# Patient Record
Sex: Female | Born: 1980 | Race: White | Hispanic: No | Marital: Married
Health system: Midwestern US, Community
[De-identification: ages and names within clinical notes are randomized; demographics above are authoritative.]

## PROBLEM LIST (undated history)

## (undated) DIAGNOSIS — D709 Neutropenia, unspecified: Secondary | ICD-10-CM

## (undated) DIAGNOSIS — F419 Anxiety disorder, unspecified: Secondary | ICD-10-CM

---

## 2014-04-28 NOTE — Progress Notes (Signed)
Diana Andrade is a 33 y.o. female Is here for a health maintenance exam.    Patient with some pain in left wrist after doing some lifting a few days ago. Better after using a splint.  She had one day of pain in mid back, worse with inspiration. No SOB. Resolved after one day. Took advil. Has a trigger finger and soreness in right thumb.   Patient is on prenatal vitamins. Sees GYN. Attempting pregnancy.    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 Better after PT.    Patient's last menstrual period was 04/08/2014 (exact date).    Allergies   Allergen Reactions   ??? Ceclor [Cefaclor] Swelling        History     Social History   ??? Marital Status: MARRIED     Spouse Name: N/A     Number of Children: N/A   ??? Years of Education: N/A     Social History Main Topics   ??? Smoking status: Never Smoker    ??? Smokeless tobacco: Never Used   ??? Alcohol Use: No   ??? Drug Use: Not on file   ??? Sexual Activity: Not on file     Other Topics Concern   ??? Not on file     Social History Narrative   ??? No narrative on file        Past Medical History   Diagnosis Date   ??? DDD (degenerative disc disease), cervical    ??? Atypical pigmented skin lesion      sees derm every 6 mos   ??? Dysplastic skin lesion      had excision  sees derm        Past Surgical History   Procedure Laterality Date   ??? Hx cesarean section       '12       Family History   Problem Relation Age of Onset   ??? Hypertension Mother    ??? Heart Disease Maternal Uncle      MI less than 50   ??? Heart Disease Maternal Grandfather      MI  less than 33 yo   ??? Cancer Paternal Grandfather      melanoma   and lung ca   ??? Diabetes Neg Hx         Current Outpatient Prescriptions   Medication Sig Dispense Refill   ??? escitalopram oxalate (LEXAPRO) 10 mg tablet Take 10 mg by mouth daily.     ??? PNV no.24-iron-folic acid-dha (PRENATAL DHA+COMPLETE PRENATAL) 30-975-300 mg-mcg-mg cmpk Take  by mouth.          ROS:  Constitutional: negative for fatigue   Eyes: negative for visual disturbance  Ears, nose, mouth, throat, and face: negative for nasal congestion  Respiratory: negative for dyspnea on exertion  Cardiovascular: negative for chest pain  Gastrointestinal: negative for melena and abdominal pain  Genitourinary:negative for dysuria  Integument/breast: negative for changed mole- sees derm on regular basis.       BP 104/74 mmHg   Pulse 76   Temp(Src) 98.6 ??F (37 ??C) (Oral)   Resp 16   Ht  (1.6 m)   Wt 118 lb (53.524 kg)   BMI 20.91 kg/m2   LMP 04/08/2014 (Exact Date)    @        Body mass index is 20.91 kg/(m^2).    Const Well, no acute distress  Eyes Conjunctiva and lids normal.  PERRLA.    ENMT External ears and nose normal.  Canals normal, TMs normal.  Hearing normal to voice.  Nose without edema or discharge, with normal septum.  Lips, teeth, gums normal.  Oropharynx: no erythema, no exudates, no lesions, normal tongue.  NECK:  Thyroid: normal size, nontender, without nodules.  Trachea midline, neck symmetrical and without masses.  RESP:  Normal air movement, and no retractions or accessory muscle use.  No rales, wheezes, rhonchi, or rubs.  CV: RRR, no murmur.  No calf tenderness. Negative Homan's  Extremities without edema.   GI:   soft, nontender, without masses.  No hepatosplenomegaly.    MSKEL:  Normal gait and station. no abnormal movement.  SKIN:  No rash, no lesions, no ulcers.  Skin warm, normal turgor, without induration or nodules.  PSYCH: .  Affect is alert and attentive.  He/Lymph:  No enlarged nodes in neck.           Diana Andrade was seen today for new patient.    Diagnoses and associated orders for this visit:    Routine general medical examination at a health care facility      patient to get [place] date of flu vaccine when done  Patient to get more info on father's colon lesion ?precancerous or cancer.    Diana Ghee, MD

## 2014-04-28 NOTE — Patient Instructions (Signed)
Trigger Finger: After Your Visit  Your Care Instructions  A trigger finger is a finger stuck in a bent position. The bent finger usually straightens out on its own. A trigger finger can be painful, but it normally is not a serious problem.  Trigger fingers seem to occur more in some groups of people. These include people who have diabetes or arthritis or who have injured their hands in the past. This problem also occurs in musicians and people who grip tools often.  Rest, exercises, and other things you can do at home may help your trigger finger relax so that it can bend as it should.  You may get a corticosteroid shot. This can reduce swelling and pain. Your doctor may put a splint on your finger. This will give your finger some rest and avoid irritating the joint. You may need surgery if the finger keeps locking in a bent position.  Follow-up care is a key part of your treatment and safety. Be sure to make and go to all appointments, and call your doctor if you are having problems. It's also a good idea to know your test results and keep a list of the medicines you take.  How can you care for yourself at home?  ?? If your doctor put a splint on your finger, wear the splint as directed. Do not remove it until your doctor says you can.  ?? You may need to change your activities to avoid movements that irritate the finger.  ?? If your finger is swollen, put ice or a cold pack on your finger for 10 to 20 minutes at a time. Try to do this every 1 to 2 hours for the next 3 days (when you are awake) or until the swelling goes down. Put a thin cloth between the ice and your skin.  ?? Prop up your hand on a pillow when you ice it or anytime you sit or lie down during the next 3 days. Try to keep it above the level of your heart. This will help reduce swelling.  ?? Take your medicines exactly as prescribed. Call your doctor if you think you are having a problem with your medicine.   ?? Ask your doctor if you can take an over-the-counter pain medicine, such as acetaminophen (Tylenol), ibuprofen (Advil, Motrin), or naproxen (Aleve). Be safe with medicines. Read and follow all instructions on the label.  ?? If your doctor recommends exercises, do them as directed.  When should you call for help?  Call your doctor now or seek immediate medical care if:  ?? Your finger locks in a bent position and will not straighten.  Watch closely for changes in your health, and be sure to contact your doctor if:  ?? You do not get better as expected.   Where can you learn more?   Go to http://www.healthwise.net/[facility]  Enter M826 in the search box to learn more about "Trigger Finger: After Your Visit."   ?? 2006-2015 Healthwise, Incorporated. Care instructions adapted under license by [place] (which disclaims liability or warranty for this information). This care instruction is for use with your licensed healthcare professional. If you have questions about a medical condition or this instruction, always ask your healthcare professional. Healthwise, Incorporated disclaims any warranty or liability for your use of this information.  Content Version: 10.5.422740; Current as of: September 21, 2013

## 2014-05-17 ENCOUNTER — Ambulatory Visit
Admit: 2014-05-17 | Discharge: 2014-05-17 | Payer: BLUE CROSS/BLUE SHIELD | Attending: Family Medicine | Primary: Family Medicine

## 2014-05-17 DIAGNOSIS — J029 Acute pharyngitis, unspecified: Secondary | ICD-10-CM

## 2014-05-17 LAB — AMB POC RAPID STREP A: Group A Strep Ag: POSITIVE

## 2014-05-17 MED ORDER — AMOXICILLIN 875 MG TAB
875 mg | ORAL_TABLET | Freq: Two times a day (BID) | ORAL | Status: DC
Start: 2014-05-17 — End: 2014-05-30

## 2014-05-17 NOTE — Progress Notes (Signed)
Diana Andrade is a 33 y.o. female that is being seen for Sore Throat    Sore throat and congestion no SOB    Allergies:  Allergies   Allergen Reactions   ??? Ceclor [Cefaclor] Swelling       Current Meds:  Current Outpatient Prescriptions   Medication Sig Dispense Refill   ??? amoxicillin (AMOXIL) 875 mg tablet Take 1 Tab by mouth two (2) times a day. 20 Tab 0   ??? escitalopram oxalate (LEXAPRO) 10 mg tablet Take 10 mg by mouth daily.     ??? PNV no.24-iron-folic acid-dha (PRENATAL DHA+COMPLETE PRENATAL) 30-975-300 mg-mcg-mg cmpk Take  by mouth.         PMH:  Past Medical History   Diagnosis Date   ??? DDD (degenerative disc disease), cervical    ??? Atypical pigmented skin lesion      sees derm every 6 mos   ??? Dysplastic skin lesion      had excision  sees derm       PSH:  Past Surgical History   Procedure Laterality Date   ??? Hx cesarean section       '12       FH:  Family History   Problem Relation Age of Onset   ??? Hypertension Mother    ??? Heart Disease Maternal Uncle      MI less than 50   ??? Heart Disease Maternal Grandfather      MI  less than 31 yo   ??? Cancer Paternal Grandfather      melanoma   and lung ca AND COLON CA   ??? Diabetes Neg Hx    ??? Cancer Paternal Grandmother      OVARIAN       Social Hx:  History     Social History   ??? Marital Status: MARRIED     Spouse Name: N/A     Number of Children: N/A   ??? Years of Education: N/A     Social History Main Topics   ??? Smoking status: Never Smoker    ??? Smokeless tobacco: Never Used   ??? Alcohol Use: No   ??? Drug Use: Not on file   ??? Sexual Activity: Not on file     Other Topics Concern   ??? Not on file     Social History Narrative       Physical Exam:  BP Readings from Last 3 Encounters:   05/17/14 110/70   04/28/14 104/74     Wt Readings from Last 3 Encounters:   05/17/14 120 lb (54.432 kg)   04/28/14 118 lb (53.524 kg)     Estimated body mass index is 21.26 kg/(m^2) as calculated from the following:    Height as of this encounter: 5\' 3"  (1.6 m).     Weight as of this encounter: 120 lb (54.432 kg).    BP 110/70 mmHg   Pulse 88   Temp(Src) 98.5 ??F (36.9 ??C) (Oral)   Resp 16   Ht 5\' 3"  (1.6 m)   Wt 120 lb (54.432 kg)   BMI 21.26 kg/m2   LMP 05/10/2014 (Exact Date)  General appearance: alert, cooperative, no distress, appears stated age  Ears: normal TM's and external ear canals AU  Throat: abnormal findings: moderate oropharyngeal erythema  Neck: supple, symmetrical, trachea midline and mild anterior cervical adenopathy  Lungs: clear to auscultation bilaterally  Heart: regular rate and rhythm, S1, S2 normal, no murmur, click, rub or gallop  Results for orders placed or performed in visit on 05/17/14   AMB POC RAPID STREP A   Result Value Ref Range    VALID INTERNAL CONTROL POC Yes     Group A Strep Ag Positive Negative       Assessment and Plan:  Diana Andrade was seen today for sore throat.    Diagnoses and associated orders for this visit:    Sore throat  - RAPID STREP ([id number])    Strep pharyngitis  - amoxicillin (AMOXIL) 875 mg tablet; Take 1 Tab by mouth two (2) times a day.      PATIENT TO GET COLONOSCOPY AT 42  Tell her GYN GM with ovarian cancer    Albertha Gheeynthia Hart Yigit Norkus, MD    ADDENDUM: HEALTH MAINTENANCE:  Health Maintenance   Topic Date Due   ??? Td Q 10 Yrs Age > 18  08/31/2001   ??? PAP AKA CERVICAL CYTOLOGY  09/01/2003   ??? INFLUENZA AGE 79 TO ADULT  03/05/2014   ??? Tdap Age > 18  Completed

## 2014-05-17 NOTE — Patient Instructions (Signed)
To tell GYN about FH ovarian ca. To get colonoscopy at age 33

## 2014-05-30 ENCOUNTER — Ambulatory Visit
Admit: 2014-05-30 | Discharge: 2014-05-30 | Payer: BLUE CROSS/BLUE SHIELD | Attending: Family Medicine | Primary: Family Medicine

## 2014-05-30 DIAGNOSIS — J029 Acute pharyngitis, unspecified: Secondary | ICD-10-CM

## 2014-05-30 LAB — AMB POC RAPID STREP A: Group A Strep Ag: POSITIVE

## 2014-05-30 MED ORDER — AZITHROMYCIN 250 MG TAB
250 mg | ORAL | Status: AC
Start: 2014-05-30 — End: 2014-06-04

## 2014-05-30 NOTE — Progress Notes (Signed)
Diana Andrade.   Diana Andrade is a 33 y.o. female that is being seen for Sore Throat    Finished amoxil 3 days ago. Congestion for 5 days and now with sore throat and chills. Had strep    Allergies:  Allergies   Allergen Reactions   ??? Ceclor [Cefaclor] Swelling       Current Meds:  Current Outpatient Prescriptions   Medication Sig Dispense Refill   ??? escitalopram oxalate (LEXAPRO) 10 mg tablet Take 10 mg by mouth daily.     ??? PNV no.24-iron-folic acid-dha (PRENATAL DHA+COMPLETE PRENATAL) 30-975-300 mg-mcg-mg cmpk Take  by mouth.         PMH:  Past Medical History   Diagnosis Date   ??? DDD (degenerative disc disease), cervical    ??? Atypical pigmented skin lesion      sees derm every 6 mos   ??? Dysplastic skin lesion      had excision  sees derm       PSH:  Past Surgical History   Procedure Laterality Date   ??? Hx cesarean section       '12       FH:  Family History   Problem Relation Age of Onset   ??? Hypertension Mother    ??? Heart Disease Maternal Uncle      MI less than 50   ??? Heart Disease Maternal Grandfather      MI  less than 31 yo   ??? Cancer Paternal Grandfather      melanoma   and lung ca AND COLON CA   ??? Diabetes Neg Hx    ??? Cancer Paternal Grandmother      OVARIAN       Social Hx:  History     Social History   ??? Marital Status: MARRIED     Spouse Name: N/A     Number of Children: N/A   ??? Years of Education: N/A     Social History Main Topics   ??? Smoking status: Never Smoker    ??? Smokeless tobacco: Never Used   ??? Alcohol Use: No   ??? Drug Use: Not on file   ??? Sexual Activity: Not on file     Other Topics Concern   ??? Not on file     Social History Narrative       Physical Exam:  BP Readings from Last 3 Encounters:   05/30/14 120/70   05/17/14 110/70   04/28/14 104/74     Wt Readings from Last 3 Encounters:   05/30/14 120 lb (54.432 kg)   05/17/14 120 lb (54.432 kg)   04/28/14 118 lb (53.524 kg)     Estimated body mass index is 21.26 kg/(m^2) as calculated from the following:    Height as of this encounter: 5\' 3"  (1.6 m).     Weight as of this encounter: 120 lb (54.432 kg).    BP 120/70 mmHg   Pulse 80   Temp(Src) 98.8 ??F (37.1 ??C) (Oral)   Resp 16   Ht 5\' 3"  (1.6 m)   Wt 120 lb (54.432 kg)   BMI 21.26 kg/m2   LMP 05/10/2014 (Exact Date)  General appearance: alert, cooperative, no distress, appears stated age  Ears: normal TM's and external ear canals AU  Throat: abnormal findings: moderate oropharyngeal erythema  Neck: supple, symmetrical, trachea midline and mild anterior cervical adenopathy  Lungs: clear to auscultation bilaterally  Heart: regular rate and rhythm, S1, S2 normal, no murmur, click,  rub or gallop    Results for orders placed or performed in visit on 05/30/14   AMB POC RAPID STREP A   Result Value Ref Range    VALID INTERNAL CONTROL POC Yes     Group A Strep Ag Positive Negative       Assessment and Plan:  Diana Andrade was seen today for sore throat.    Diagnoses and all orders for this visit:    Pharyngitis  Orders:  -     RAPID STREP ([id number])    Strep pharyngitis  Orders:  -     azithromycin (ZITHROMAX) 250 mg tablet; Take two tablets today then one tablet daily        Diana Gheeynthia Hart Atticus Wedin, MD    ADDENDUM: HEALTH MAINTENANCE:  Health Maintenance   Topic Date Due   ??? Td Q 10 Yrs Age > 18  08/31/2001   ??? PAP AKA CERVICAL CYTOLOGY  09/01/2003   ??? INFLUENZA AGE 23 TO ADULT  03/05/2014   ??? Tdap Age > 18  Completed

## 2014-07-04 ENCOUNTER — Ambulatory Visit
Admit: 2014-07-04 | Discharge: 2014-07-04 | Payer: BLUE CROSS/BLUE SHIELD | Attending: Family Medicine | Primary: Family Medicine

## 2014-07-04 ENCOUNTER — Encounter: Attending: Family Medicine | Primary: Family Medicine

## 2014-07-04 DIAGNOSIS — J029 Acute pharyngitis, unspecified: Secondary | ICD-10-CM

## 2014-07-04 LAB — AMB POC RAPID STREP A: Group A Strep Ag: NEGATIVE

## 2014-07-04 MED ORDER — AZITHROMYCIN 250 MG TAB
250 mg | ORAL | Status: AC
Start: 2014-07-04 — End: 2014-07-09

## 2014-07-04 MED ORDER — ESCITALOPRAM 10 MG TAB
10 mg | ORAL_TABLET | Freq: Every day | ORAL | Status: AC
Start: 2014-07-04 — End: ?

## 2014-07-04 NOTE — Progress Notes (Signed)
Diana Andrade is a 33 y.o. female that is being seen for Sore Throat and Diarrhea    Fever and sore throat. 2 days ago. Myalgias. No cough. No PND. No sinus pressure  Had diarrhea yesterday. Watery. No blood. None since yesterday. Some nausea.     Allergies:  Allergies   Allergen Reactions   ??? Ceclor [Cefaclor] Swelling       Current Meds:  Current Outpatient Prescriptions   Medication Sig Dispense Refill   ??? escitalopram oxalate (LEXAPRO) 10 mg tablet Take 1 Tab by mouth daily. 30 Tab 11   ??? azithromycin (ZITHROMAX) 250 mg tablet Take two tablets today then one tablet daily 6 Each 0   ??? PNV no.24-iron-folic acid-dha (PRENATAL DHA+COMPLETE PRENATAL) 30-975-300 mg-mcg-mg cmpk Take  by mouth.         PMH:  Past Medical History   Diagnosis Date   ??? DDD (degenerative disc disease), cervical    ??? Atypical pigmented skin lesion      sees derm every 6 mos   ??? Dysplastic skin lesion      had excision  sees derm       PSH:  Past Surgical History   Procedure Laterality Date   ??? Hx cesarean section       '12       FH:  Family History   Problem Relation Age of Onset   ??? Hypertension Mother    ??? Heart Disease Maternal Uncle      MI less than 50   ??? Heart Disease Maternal Grandfather      MI  less than 31 yo   ??? Cancer Paternal Grandfather      melanoma   and lung ca AND COLON CA   ??? Diabetes Neg Hx    ??? Cancer Paternal Grandmother      OVARIAN       Social Hx:  History     Social History   ??? Marital Status: MARRIED     Spouse Name: N/A     Number of Children: N/A   ??? Years of Education: N/A     Social History Main Topics   ??? Smoking status: Never Smoker    ??? Smokeless tobacco: Never Used   ??? Alcohol Use: No   ??? Drug Use: Not on file   ??? Sexual Activity: Not on file     Other Topics Concern   ??? Not on file     Social History Narrative       Physical Exam:  BP Readings from Last 3 Encounters:   07/04/14 110/72   05/30/14 120/70   05/17/14 110/70     Wt Readings from Last 3 Encounters:   07/04/14 121 lb (54.885 kg)    05/30/14 120 lb (54.432 kg)   05/17/14 120 lb (54.432 kg)     Estimated body mass index is 21.44 kg/(m^2) as calculated from the following:    Height as of this encounter: 5\' 3"  (1.6 m).    Weight as of this encounter: 121 lb (54.885 kg).    BP 110/72 mmHg   Pulse 76   Temp(Src) 99.2 ??F (37.3 ??C) (Oral)   Resp 16   Ht 5\' 3"  (1.6 m)   Wt 121 lb (54.885 kg)   BMI 21.44 kg/m2  General appearance: alert, cooperative, no distress, appears stated age  Ears: normal TM's and external ear canals AU  Throat: abnormal findings: moderate oropharyngeal erythema  Neck: supple, symmetrical, trachea midline  and no adenopathy  Lungs: clear to auscultation bilaterally  Heart: regular rate and rhythm, S1, S2 normal, no murmur, click, rub or gallop    Results for orders placed or performed in visit on 07/04/14   AMB POC RAPID STREP A   Result Value Ref Range    VALID INTERNAL CONTROL POC Yes     Group A Strep Ag Negative Negative       Assessment and Plan:  Diana Andrade was seen today for sore throat and diarrhea.    Diagnoses and all orders for this visit:    Pharyngitis  Orders:  -     RAPID STREP ([id number])  -     azithromycin (ZITHROMAX) 250 mg tablet; Take two tablets today then one tablet daily    Anxiety  Orders:  -     escitalopram oxalate (LEXAPRO) 10 mg tablet; Take 1 Tab by mouth daily.        Diana Gheeynthia Hart Travaris Kosh, MD    ADDENDUM: HEALTH MAINTENANCE:  Health Maintenance   Topic Date Due   ??? Td Q 10 Yrs Age > 18  08/31/2001   ??? PAP AKA CERVICAL CYTOLOGY  09/01/2003   ??? INFLUENZA AGE 22 TO ADULT  03/05/2014   ??? Tdap Age > 18  Completed

## 2015-07-04 ENCOUNTER — Encounter

## 2016-03-25 ENCOUNTER — Ambulatory Visit
Admit: 2016-03-25 | Discharge: 2016-03-25 | Payer: BLUE CROSS/BLUE SHIELD | Attending: Family Medicine | Primary: Family Medicine

## 2016-03-25 DIAGNOSIS — M26609 Unspecified temporomandibular joint disorder, unspecified side: Secondary | ICD-10-CM

## 2016-03-25 NOTE — Progress Notes (Signed)
Diana Andrade is a 35 y.o. female that is being seen for Ear Pain    Patient has developed some pain about the right TMJ post intubation a few weeks ago.  Pain is increased with chewing certain foods.  Also feels like there may be some discomfort with the right ear.  She has taken an occasional Advil.  Feels like she is not opening the mouth fully.  No sinus congestion.  No fever.          Allergies:  Allergies   Allergen Reactions   ??? Ceclor [Cefaclor] Swelling       Current Meds:  Current Outpatient Prescriptions   Medication Sig Dispense Refill   ??? escitalopram oxalate (LEXAPRO) 10 mg tablet Take 1 Tab by mouth daily. 30 Tab 11   ??? PNV no.24-iron-folic acid-dha (PRENATAL DHA+COMPLETE PRENATAL) 30-975-300 mg-mcg-mg cmpk Take  by mouth.         PMH:  Past Medical History:   Diagnosis Date   ??? Atypical pigmented skin lesion     sees derm every 6 mos   ??? DDD (degenerative disc disease), cervical    ??? Dysplastic skin lesion     had excision  sees derm       PSH:  Past Surgical History:   Procedure Laterality Date   ??? HX CESAREAN SECTION      '12       FH:  Family History   Problem Relation Age of Onset   ??? Hypertension Mother    ??? Heart Disease Maternal Uncle      MI less than 50   ??? Heart Disease Maternal Grandfather      MI  less than 69 yo   ??? Cancer Paternal Grandfather      melanoma   and lung ca AND COLON CA   ??? Diabetes Neg Hx    ??? Cancer Paternal Grandmother      OVARIAN       Social Hx:  Social History     Social History   ??? Marital status: MARRIED     Spouse name: N/A   ??? Number of children: N/A   ??? Years of education: N/A     Social History Main Topics   ??? Smoking status: Never Smoker   ??? Smokeless tobacco: Never Used   ??? Alcohol use No   ??? Drug use: None   ??? Sexual activity: Not Asked     Other Topics Concern   ??? None     Social History Narrative       Physical Exam:  BP Readings from Last 3 Encounters:   03/25/16 140/72   07/04/14 110/72   05/30/14 120/70     Wt Readings from Last 3 Encounters:    03/25/16 124 lb (56.2 kg)   07/04/14 121 lb (54.9 kg)   05/30/14 120 lb (54.4 kg)     Estimated body mass index is 21.97 kg/(m^2) as calculated from the following:    Height as of this encounter: 5\' 3"  (1.6 m).    Weight as of this encounter: 124 lb (56.2 kg).    Visit Vitals   ??? BP 140/72 (BP 1 Location: Left arm, BP Patient Position: Sitting)   ??? Pulse 83   ??? Temp 99.3 ??F (37.4 ??C) (Oral)   ??? Resp 16   ??? Ht 5\' 3"  (1.6 m)   ??? Wt 124 lb (56.2 kg)   ??? SpO2 100%   ??? BMI 21.97 kg/m2  General appearance: alert, cooperative, no distress, appears stated age  Head: Normocephalic, without obvious abnormality, atraumatic  Eyes: negative findings: anicteric sclera, lids and lashes normal and conjunctivae and sclerae normal  Ears: normal TM's and external ear canals right ear  Throat: Lips, mucosa, and tongue normal. Teeth and gums normal and normal findings: Oropharynx normal  Neck: No lymphadenopathy  Skin: No rashes or lesions  Full range of motion of the right TMJ.  No crepitus noted.  No tenderness.  No popping.    No results found for any visits on 03/25/16.    Assessment and Plan:  Diagnoses and all orders for this visit:    1. Temporomandibular disorder    To take ibuprofen 200 mg 3 tablets twice a day with food  Handout given  To avoid any foods that cause increased pain.  Recheck if pain persists more than next 10-14 days.    Creta Levinynthia H Akima Slaugh, MD    Patient Instructions        Temporomandibular Disorder: Care Instructions  Your Care Instructions    Temporomandibular (TM) disorders are a problem with the muscles and joints that connect your jaw to your skull. They cause pain when you open your mouth, chew, or yawn. You may feel this pain on one or both sides.  TM disorders are often caused by tight jaw muscles. The tightness can be caused by clenching or grinding your teeth. This may happen when you have a lot of stress in your life.  If you lower your stress, you may be able to stop clenching or grinding  your teeth. This will help relax your jaw and reduce your pain.  You may also be able to do some things at home to feel better. But if none of this works, your doctor may prescribe medicine to help relax your muscles and control the pain.  Follow-up care is a key part of your treatment and safety. Be sure to make and go to all appointments, and call your doctor if you are having problems. It's also a good idea to know your test results and keep a list of the medicines you take.  How can you care for yourself at home?  ?? Put a warm, moist cloth or heating pad set on low on your jaw. Do this for 10 to 20 minutes at a time. Put a thin cloth between the heating pad and your skin.  ?? Avoid hard or chewy foods that cause your jaws to work very hard. Examples include popcorn, jerky, tough meats, chewy breads, gum, and raw apples and carrots.  ?? Choose softer foods that are easy to chew. These include eggs, yogurt, and soup.  ?? Cut your food into small pieces. Chew slowly.  ?? If your jaw gets too painful to chew, or if it locks, you may need to puree your food for a few days or weeks.  ?? To relax your jaw, repeat this exercise for a few minutes every morning and evening. Watch yourself in a mirror. Gently open and close your mouth. Move your jaw straight up and down. But don't do this if it makes your pain worse.  ?? Get at least 30 minutes of exercise on most days of the week to relieve stress. Walking is a good choice. You also may want to do other activities, such as running, swimming, cycling, or playing tennis or team sports.  ?? Do not:  ?? Hold a phone between your shoulder and your jaw.  ??  Open your mouth all the way, like when you sing loudly or yawn.  ?? Clench or grind your teeth, bite your lips, or chew your fingernails.  ?? Clench things such as pens, pipes, or cigars between your teeth.  When should you call for help?  Call your doctor now or seek immediate medical care if:   ?? Your jaw is locked open or shut or it is hard to move your jaw.  Watch closely for changes in your health, and be sure to contact your doctor if:  ?? Your jaw pain gets worse.  ?? Your face is swollen.  ?? You do not get better as expected.  Where can you learn more?  Go to [employer]http://www.healthwise.net/[facility].  Enter [phone] in the search box to learn more about "Temporomandibular Disorder: Care Instructions."  Current as of: March 14, 2015  Content Version: 11.3  ?? 2006-2017 [employer]. Care instructions adapted under license by [employer] (which disclaims liability or warranty for this information). If you have questions about a medical condition or this instruction, always ask your healthcare professional. [employer] disclaims any warranty or liability for your use of this information.         ADDENDUM: HEALTH MAINTENANCE:  Health Maintenance   Topic Date Due   ??? DTaP/Tdap/Td series (1 - Tdap) 09/01/2003   ??? PAP AKA CERVICAL CYTOLOGY  09/01/2003   ??? INFLUENZA AGE 25 TO ADULT  03/05/2016

## 2016-03-25 NOTE — Patient Instructions (Signed)
Temporomandibular Disorder: Care Instructions  Your Care Instructions    Temporomandibular (TM) disorders are a problem with the muscles and joints that connect your jaw to your skull. They cause pain when you open your mouth, chew, or yawn. You may feel this pain on one or both sides.  TM disorders are often caused by tight jaw muscles. The tightness can be caused by clenching or grinding your teeth. This may happen when you have a lot of stress in your life.  If you lower your stress, you may be able to stop clenching or grinding your teeth. This will help relax your jaw and reduce your pain.  You may also be able to do some things at home to feel better. But if none of this works, your doctor may prescribe medicine to help relax your muscles and control the pain.  Follow-up care is a key part of your treatment and safety. Be sure to make and go to all appointments, and call your doctor if you are having problems. It's also a good idea to know your test results and keep a list of the medicines you take.  How can you care for yourself at home?  ?? Put a warm, moist cloth or heating pad set on low on your jaw. Do this for 10 to 20 minutes at a time. Put a thin cloth between the heating pad and your skin.  ?? Avoid hard or chewy foods that cause your jaws to work very hard. Examples include popcorn, jerky, tough meats, chewy breads, gum, and raw apples and carrots.  ?? Choose softer foods that are easy to chew. These include eggs, yogurt, and soup.  ?? Cut your food into small pieces. Chew slowly.  ?? If your jaw gets too painful to chew, or if it locks, you may need to puree your food for a few days or weeks.  ?? To relax your jaw, repeat this exercise for a few minutes every morning and evening. Watch yourself in a mirror. Gently open and close your mouth. Move your jaw straight up and down. But don't do this if it makes your pain worse.  ?? Get at least 30 minutes of exercise on most days of the week to relieve  stress. Walking is a good choice. You also may want to do other activities, such as running, swimming, cycling, or playing tennis or team sports.  ?? Do not:  ?? Hold a phone between your shoulder and your jaw.  ?? Open your mouth all the way, like when you sing loudly or yawn.  ?? Clench or grind your teeth, bite your lips, or chew your fingernails.  ?? Clench things such as pens, pipes, or cigars between your teeth.  When should you call for help?  Call your doctor now or seek immediate medical care if:  ?? Your jaw is locked open or shut or it is hard to move your jaw.  Watch closely for changes in your health, and be sure to contact your doctor if:  ?? Your jaw pain gets worse.  ?? Your face is swollen.  ?? You do not get better as expected.  Where can you learn more?  Go to http://www.healthwise.net/GoodHelpConnections.  Enter P868 in the search box to learn more about "Temporomandibular Disorder: Care Instructions."  Current as of: March 14, 2015  Content Version: 11.3  ?? 2006-2017 [employer]. Care instructions adapted under license by [employer] (which disclaims liability or warranty for this information). If   you have questions about a medical condition or this instruction, always ask your healthcare professional. [employer] disclaims any warranty or liability for your use of this information.

## 2016-09-02 ENCOUNTER — Ambulatory Visit
Admit: 2016-09-02 | Discharge: 2016-09-02 | Payer: BLUE CROSS/BLUE SHIELD | Attending: Family Medicine | Primary: Family Medicine

## 2016-09-02 DIAGNOSIS — R6889 Other general symptoms and signs: Secondary | ICD-10-CM

## 2016-09-02 LAB — AMB POC RAPID INFLUENZA TEST
Influenza A Ag POC: NEGATIVE Pos/Neg
Influenza B Ag POC: NEGATIVE Pos/Neg

## 2016-09-02 MED ORDER — OSELTAMIVIR PHOSPHATE 75 MG CAP
75 mg | ORAL_CAPSULE | Freq: Two times a day (BID) | ORAL | 0 refills | Status: AC
Start: 2016-09-02 — End: 2016-09-07

## 2016-09-02 NOTE — Progress Notes (Signed)
Diana Andrade is a 36 y.o. female that is being seen for Generalized Body Aches    Patient on Bactrim for sinusitis.  She has myalgias.  Some chills.  No sore throat.  No cough.  Feels extremely fatigued.  Has been exposed to the fluid school.          Allergies:  Allergies   Allergen Reactions   ??? Ceclor [Cefaclor] Swelling       Current Meds:  Current Outpatient Prescriptions   Medication Sig Dispense Refill   ??? acetaminophen (TYLENOL) 500 mg tablet Take  by mouth.     ??? trimethoprim-sulfamethoxazole (BACTRIM DS, SEPTRA DS) 160-800 mg per tablet TAKE 1 TABLET (ORAL) 2 TIMES PER DAY FOR 10 DAYS  0   ??? oseltamivir (TAMIFLU) 75 mg capsule Take 1 Cap by mouth two (2) times a day for 5 days. 10 Cap 0   ??? escitalopram oxalate (LEXAPRO) 10 mg tablet Take 1 Tab by mouth daily. 30 Tab 11       PMH:  Past Medical History:   Diagnosis Date   ??? Atypical pigmented skin lesion     sees derm every 6 mos   ??? DDD (degenerative disc disease), cervical    ??? Dysplastic skin lesion     had excision  sees derm       PSH:  Past Surgical History:   Procedure Laterality Date   ??? HX CESAREAN SECTION      '12       FH:  Family History   Problem Relation Age of Onset   ??? Hypertension Mother    ??? Heart Disease Maternal Uncle      MI less than 50   ??? Heart Disease Maternal Grandfather      MI  less than 34 yo   ??? Cancer Paternal Grandfather      melanoma   and lung ca AND COLON CA   ??? Diabetes Neg Hx    ??? Cancer Paternal Grandmother      OVARIAN       Social Hx:  Social History     Social History   ??? Marital status: MARRIED     Spouse name: N/A   ??? Number of children: N/A   ??? Years of education: N/A     Social History Main Topics   ??? Smoking status: Never Smoker   ??? Smokeless tobacco: Never Used   ??? Alcohol use No   ??? Drug use: None   ??? Sexual activity: Not Asked     Other Topics Concern   ??? None     Social History Narrative       Physical Exam:  BP Readings from Last 3 Encounters:   09/02/16 104/60   03/25/16 140/72   07/04/14 110/72      Wt Readings from Last 3 Encounters:   09/02/16 129 lb (58.5 kg)   03/25/16 124 lb (56.2 kg)   07/04/14 121 lb (54.9 kg)     Estimated body mass index is 22.85 kg/(m^2) as calculated from the following:    Height as of this encounter: 5\' 3"  (1.6 m).    Weight as of this encounter: 129 lb (58.5 kg).    Visit Vitals   ??? BP 104/60 (BP 1 Location: Left arm, BP Patient Position: Sitting)   ??? Pulse 90   ??? Temp 99.5 ??F (37.5 ??C) (Oral)   ??? Resp 16   ??? Ht 5\' 3"  (1.6 m)   ???  Wt 129 lb (58.5 kg)   ??? LMP 07/28/2016   ??? SpO2 99%   ??? BMI 22.85 kg/m2     General appearance: alert, cooperative, no distress, appears stated age  Head: Normocephalic, without obvious abnormality, atraumatic  Eyes: negative findings: anicteric sclera, lids and lashes normal and conjunctivae and sclerae normal  Ears: normal TM's and external ear canals AU  Throat: Lips, mucosa, and tongue normal. Teeth and gums normal and normal findings: oropharynx pink & moist without lesions or evidence of thrush  Neck: supple, symmetrical, trachea midline  Lungs: clear to auscultation bilaterally  Heart: regular rate and rhythm, S1, S2 normal, no murmur, click, rub or gallop  Skin: Skin color, texture, turgor normal. No rashes or lesions  Affect normal    Results for orders placed or performed in visit on 09/02/16   AMB POC RAPID INFLUENZA TEST   Result Value Ref Range    VALID INTERNAL CONTROL POC Yes     Influenza A Ag POC Negative Negative Pos/Neg    Influenza B Ag POC Negative Negative Pos/Neg       Assessment and Plan:  Diagnoses and all orders for this visit:    1. Flu-like symptoms  -     INFLUENZA TEST A & B (02725)    2. Screening for deficiency anemia  -     CBC WITH AUTOMATED DIFF; Future    3. Screening, lipid  -     LIPID PANEL WITH LDL/HDL RATIO; Future    4. Screening for diabetes mellitus  -     CMP; Future    5. Screening for blood or protein in urine  -     UA/M W/RFLX CULTURE, COMP; Future    Other orders   -     oseltamivir (TAMIFLU) 75 mg capsule; Take 1 Cap by mouth two (2) times a day for 5 days.-To monitor symptoms over the next few hours and start if worsening    Not to get labs done until I review her labs done at work          Diana Levin, MD    There are no Patient Instructions on file for this visit.     ADDENDUM: HEALTH MAINTENANCE:  Health Maintenance   Topic Date Due   ??? DTaP/Tdap/Td series (1 - Tdap) 09/01/2003   ??? PAP AKA CERVICAL CYTOLOGY  09/01/2003   ??? Influenza Age 69 to Adult  03/05/2016

## 2016-09-05 ENCOUNTER — Encounter: Attending: Family Medicine | Primary: Family Medicine

## 2016-09-24 ENCOUNTER — Ambulatory Visit
Admit: 2016-09-24 | Discharge: 2016-09-24 | Payer: BLUE CROSS/BLUE SHIELD | Attending: Family Medicine | Primary: Family Medicine

## 2016-09-24 DIAGNOSIS — Z0000 Encounter for general adult medical examination without abnormal findings: Secondary | ICD-10-CM

## 2016-09-24 NOTE — Progress Notes (Signed)
Diana Andrade is a 35 y.o. female Is here for a health maintenance exam.  Patient does want a gyn exam.    Specific concerns today: none.  She does not need to consider a mammogram at this time.  Patient's last Pap was a couple of year(s) ago  She has not had a hysterectomy  She does not have a history of abnormal Paps.  Patient's last menstrual period was 09/18/2016 (exact date).  Grandmother with ovarian cancer. +FH colon cancer and melanoma.   Allergies   Allergen Reactions   ??? Ceclor [Cefaclor] Swelling        Social History     Social History   ??? Marital status: MARRIED     Spouse name: N/A   ??? Number of children: N/A   ??? Years of education: N/A     Social History Main Topics   ??? Smoking status: Never Smoker   ??? Smokeless tobacco: Never Used   ??? Alcohol use No   ??? Drug use: None   ??? Sexual activity: Not Asked     Other Topics Concern   ??? None     Social History Narrative        Past Medical History:   Diagnosis Date   ??? Atypical pigmented skin lesion     sees derm every 6 mos   ??? Chronic leukopenia    ??? DDD (degenerative disc disease), cervical    ??? Dysplastic skin lesion     had excision  sees derm        Past Surgical History:   Procedure Laterality Date   ??? HX CESAREAN SECTION      '12   ??? HX OOPHORECTOMY      bilateral  due to ectopic preg       Family History   Problem Relation Age of Onset   ??? Hypertension Mother    ??? Heart Disease Maternal Uncle      MI less than 50   ??? Heart Disease Maternal Grandfather      MI  less than 11 yo   ??? Cancer Paternal Grandfather      melanoma   and lung ca AND COLON CA   ??? Cancer Paternal Grandmother      OVARIAN   ??? Diabetes Neg Hx         Current Outpatient Prescriptions   Medication Sig Dispense Refill   ??? acetaminophen (TYLENOL) 500 mg tablet Take  by mouth.     ??? escitalopram oxalate (LEXAPRO) 10 mg tablet Take 1 Tab by mouth daily. 30 Tab 11        ROS:     Ears, nose, mouth, throat, and face: positive for nasal congestion  Respiratory: positive for cough   Cardiovascular: neg for CP  Gastrointestinal: negative for melena and abdominal pain  Genitourinary:negative for dysuria  Integument/breast: negative for breast lump  Musculoskeletal:negative  Neurological: negative      Visit Vitals   ??? BP 120/70 (BP 1 Location: Left arm, BP Patient Position: Sitting)   ??? Pulse 89   ??? Temp 98.8 ??F (37.1 ??C) (Oral)   ??? Resp 16   ??? Ht 5\' 3"  (1.6 m)   ??? Wt 127 lb (57.6 kg)   ??? LMP 09/18/2016 (Exact Date)   ??? SpO2 99%   ??? BMI 22.5 kg/m2       @WEIGHTCHANGE @        Body mass index is 22.5 kg/(m^2).    Const  Well, no acute distress  Eyes Conjunctiva and lids normal.  PERRLA.    ENMT External ears and nose normal.  Canals normal, TMs normal.  Hearing normal to voice.  Nose without edema or discharge, with normal septum.  Lips, teeth, gums normal.  Oropharynx: no erythema, no exudates, no lesions, normal tongue.  NECK:  Thyroid: normal size, nontender, without nodules.  Trachea midline, neck symmetrical and without masses.  RESP:  Normal air movement, and no retractions or accessory muscle use.  No rales, wheezes, rhonchi, or rubs.  CV: RRR, no murmur.  Extremities without edema.   GI:   soft, nontender, without masses.  No hepatosplenomegaly.  MSKEL:  Normal gait and station.  Normal digits and nails.  Normal strength and tone, no atrophy, and no abnormal movement.  SKIN:  No rash, no lesions, no ulcers.  Skin warm, normal turgor, without induration or nodules.  PSYCH: .  Affect is alert and attentive.  He/Lymph:  No enlarged nodes in neck.    BREAST EXAM: breasts appear normal, no suspicious masses, no skin or nipple changes or axillary nodes  PELVIC EXAM: normal external genitalia, vulva, vagina, cervix, uterus and adnexa       Diagnoses and all orders for this visit:    1. Health maintenance examination    2. Screening for cervical cancer  -     PAP, IG, RFX HPV ASCUS ([id number])      Urged patient to consider genetic testing (GM with ovarian cancer, GF with melanoma and colon cancer.)     Patient to email copy of recent labs to us.     Diana Levinynthia H Hani Campusano, MD      There are no Patient Instructions on file for this visit.

## 2016-09-28 LAB — PAP, IG, RFX HPV ASCUS (507301)
.: 0
LABCORP 019018: 0

## 2016-09-29 ENCOUNTER — Encounter

## 2016-10-18 ENCOUNTER — Encounter: Admit: 2016-10-18 | Discharge: 2016-10-18 | Payer: BLUE CROSS/BLUE SHIELD | Primary: Family Medicine

## 2016-10-18 DIAGNOSIS — D709 Neutropenia, unspecified: Secondary | ICD-10-CM

## 2016-10-19 LAB — FOLATE: Folate: 15.5 ng/mL (ref >3.0)

## 2016-10-19 LAB — VITAMIN B12: Vitamin B12: 409 pg/mL (ref 232–1245)

## 2016-10-27 LAB — PATHOLOGIST REVIEW SMEARS
ABS. BASOPHILS: 0 10*3/uL (ref 0.0–0.2)
ABS. EOSINOPHILS: 0.1 10*3/uL (ref 0.0–0.4)
ABS. IMM. GRANS.: 0 10*3/uL (ref 0.0–0.1)
ABS. MONOCYTES: 0.2 10*3/uL (ref 0.1–0.9)
ABS. NEUTROPHILS: 1.8 10*3/uL (ref 1.4–7.0)
Abs Lymphocytes: 1.8 10*3/uL (ref 0.7–3.1)
BASOPHILS: 1 %
Basophils %: 1 %
Basophils Absolute: 0 10*3/uL (ref 0.0–0.2)
EOSINOPHILS: 2 %
Eosinophils %: 2 %
Eosinophils Absolute: 0.1 10*3/uL (ref 0.0–0.4)
Granulocyte Absolute Count: 0 10*3/uL (ref 0.0–0.1)
HCT: 39.2 % (ref 34.0–46.6)
HGB: 13.3 g/dL (ref 11.1–15.9)
Hematocrit: 39.2 % (ref 34.0–46.6)
Hemoglobin: 13.3 g/dL (ref 11.1–15.9)
IMMATURE GRANULOCYTES: 0 %
Immature Granulocytes: 0 %
Lymphocytes %: 46 %
Lymphocytes Absolute: 1.8 10*3/uL (ref 0.7–3.1)
Lymphocytes: 46 %
MCH: 30.2 pg (ref 26.6–33.0)
MCH: 30.2 pg (ref 26.6–33.0)
MCHC: 33.9 g/dL (ref 31.5–35.7)
MCHC: 33.9 g/dL (ref 31.5–35.7)
MCV: 89 fL (ref 79–97)
MCV: 89 fL (ref 79–97)
MONOCYTES: 6 %
Monocytes %: 6 %
Monocytes Absolute: 0.2 10*3/uL (ref 0.1–0.9)
NEUTROPHILS: 45 %
Neutrophils %: 45 %
Neutrophils Absolute: 1.8 10*3/uL (ref 1.4–7.0)
PLATELET: 281 10*3/uL (ref 150–379)
PLTS, 005070: NORMAL
Platelets: 281 10*3/uL (ref 150–379)
Platelets: NORMAL
RBC, 005068: NORMAL
RBC: 4.4 x10E6/uL (ref 3.77–5.28)
RBC: 4.4 x10E6/uL (ref 3.77–5.28)
RBC: NORMAL
RDW: 13.4 % (ref 12.3–15.4)
RDW: 13.4 % (ref 12.3–15.4)
WBC, 005069: NORMAL
WBC: 3.9 10*3/uL (ref 3.4–10.8)
WBC: 3.9 10*3/uL (ref 3.4–10.8)
WBC: NORMAL

## 2016-10-27 NOTE — Progress Notes (Signed)
Repeat labs normal  Diana Garriga H Irving Lubbers, MD

## 2022-09-23 IMAGING — MG MAMMO BREAST SCREENING TOMOSYNTHESIS BILATERAL
9 of 14 series · 9 of 30 positions shown · non-contrast
Comparison: None.

This is a summary report. The complete report is available in the patient's medical record. If you cannot access the medical record, please contact the sending organization for a detailed fax or copy.
FINAL REPORT:
EXAM: MAMMO BREAST SCREENING TOMOSYNTHESIS 3D BILATERAL
CLINICAL HISTORY: Screening mammogram. No complaints.
TECHNIQUE: MLO and CC views of both breasts with tomosynthesis were obtained on a digital full-field mammographic unit. This examination was interpreted in conjunction with computer aided detection system. Nipple and skin markers were placed as needed.

[L XCCL]
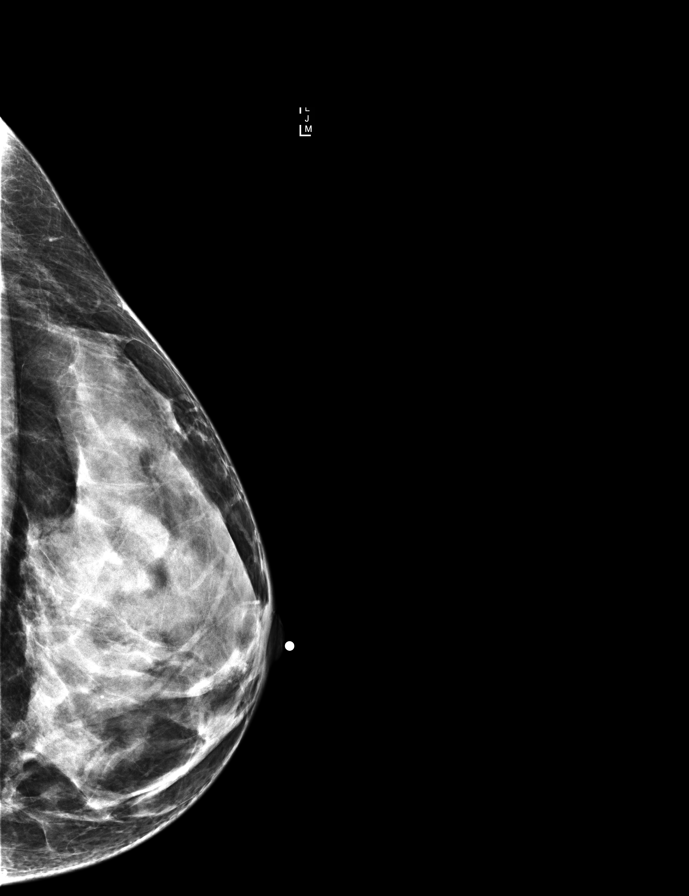

[R XCCL]
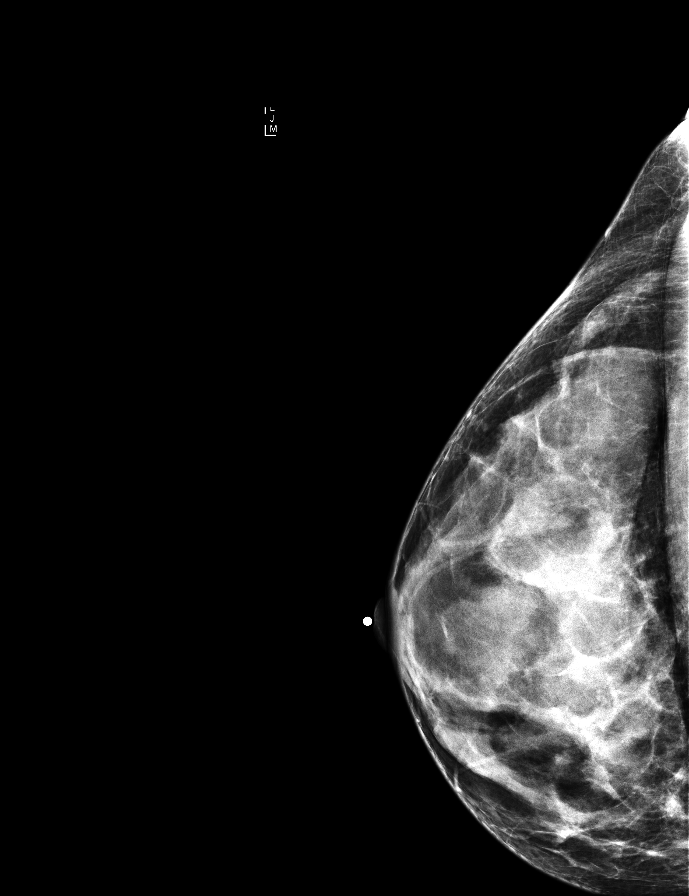

[R MLO]
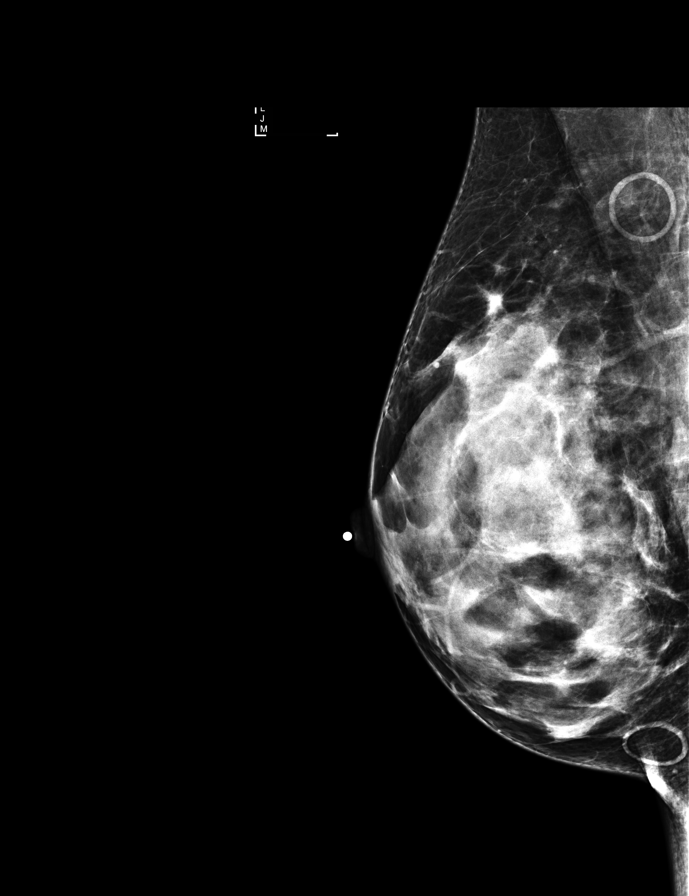

[R CC synth-2D]
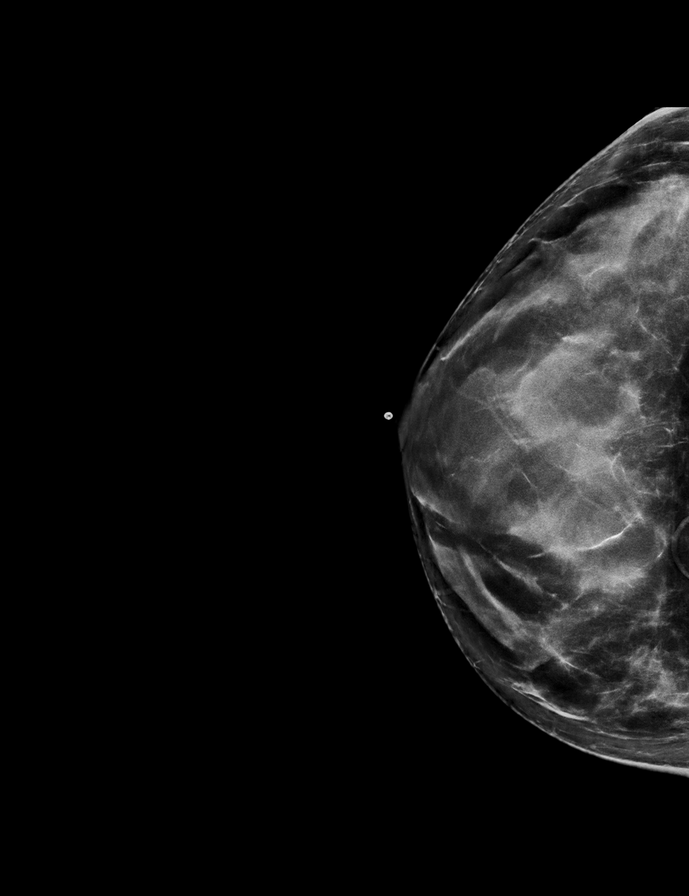

[R MLO synth-2D]
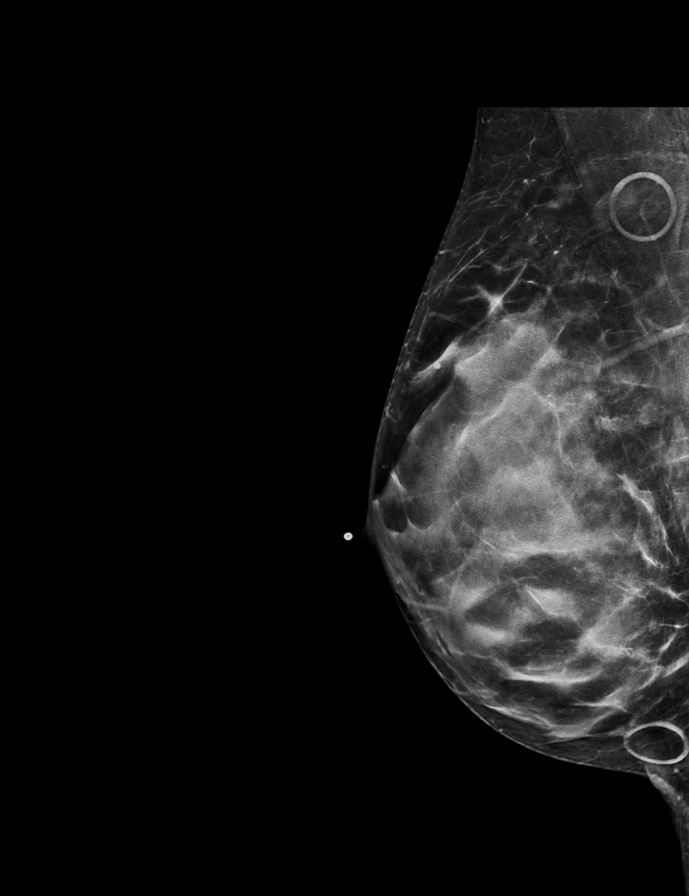

[R CC]
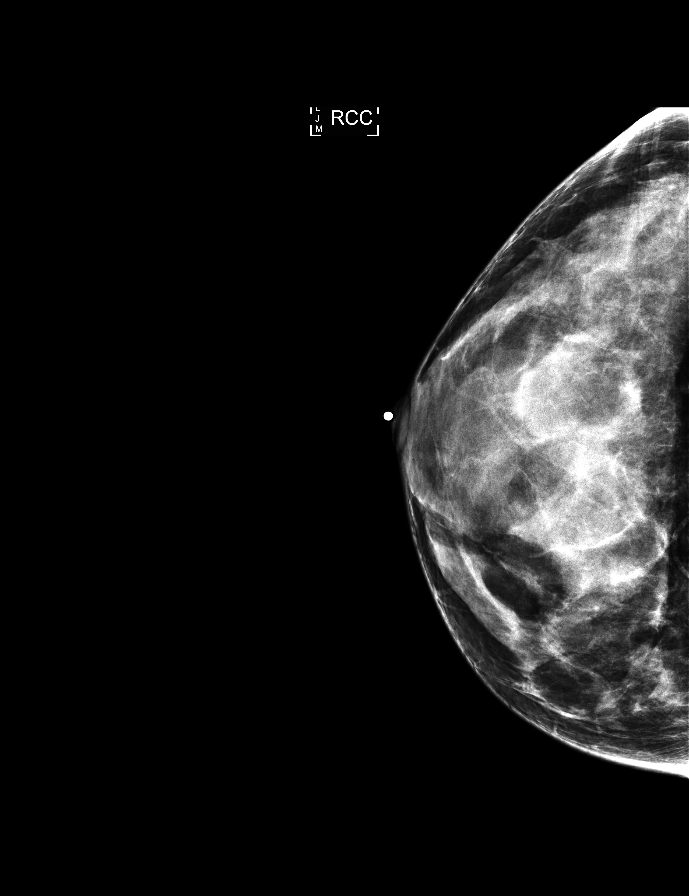

[L MLO synth-2D]
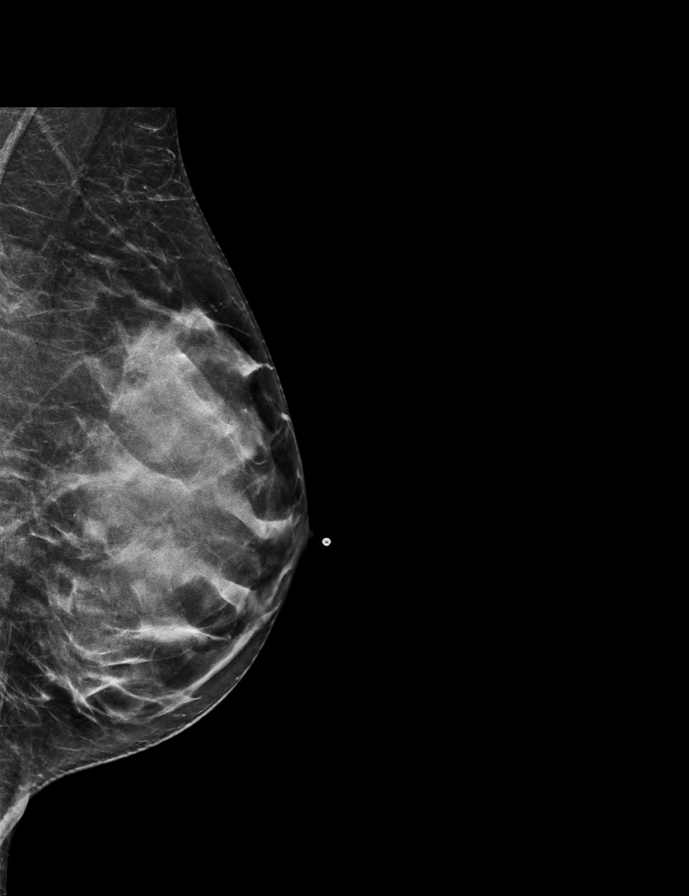

[L CC]
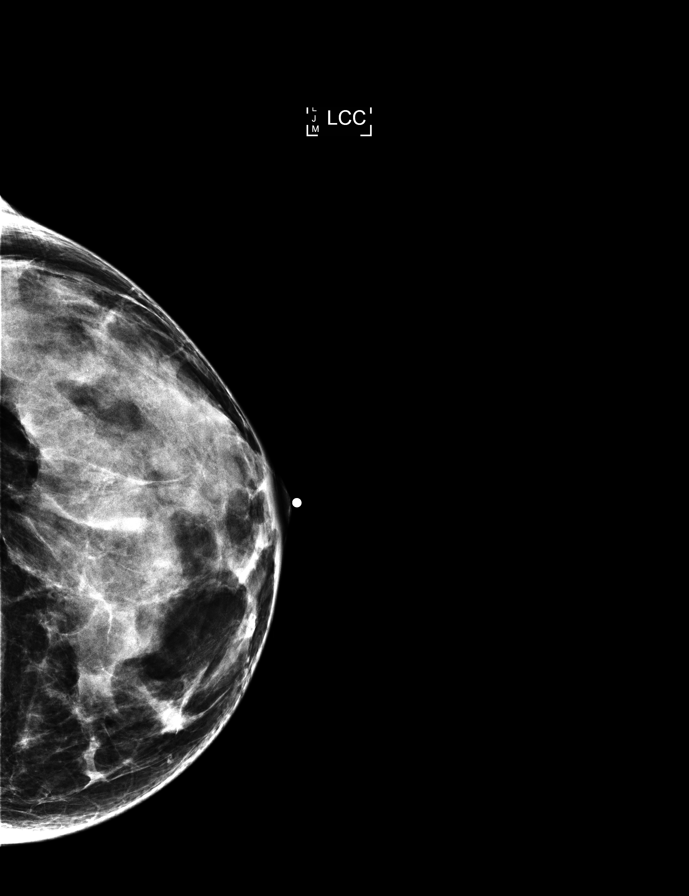

[L MLO]
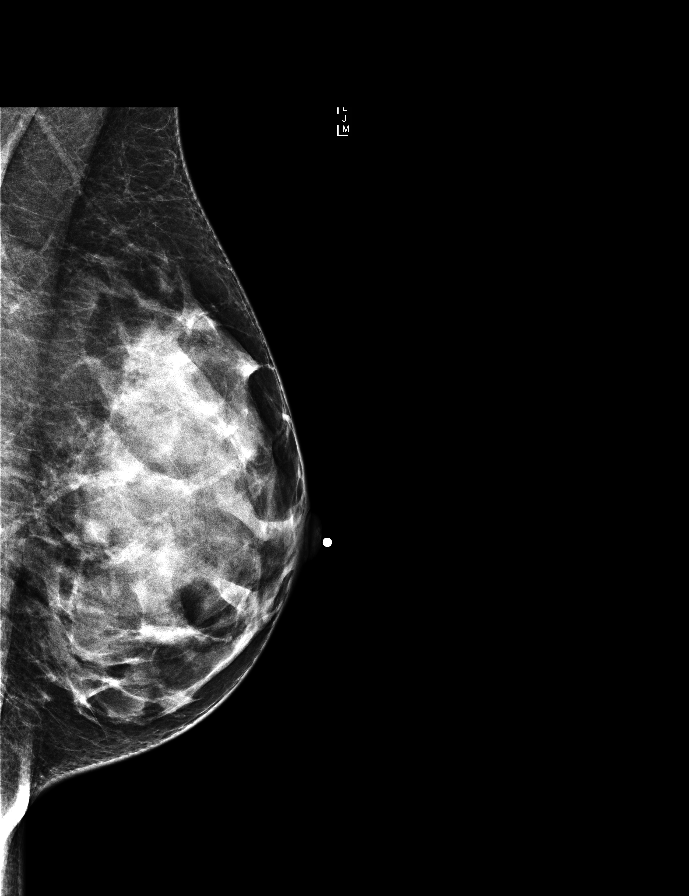

[9 of 30 positions shown; findings below may reference images not displayed]

FINDINGS: BREAST DENSITY TYPE D: The breasts are extremely dense, which lowers the sensitivity of mammography.
When compared to the prior exam, there has been no significant interval change. The skin, nipples, and both axillae are unremarkable. There is no evidence of a dominant mass, suspicious cluster of calcifications, or architectural distortion in either breast.
IMPRESSION: No mammographic evidence of malignancy.
BI-RADS Category 1: Negative
Recommendation(s): Bilateral screening mammogram in one year.
The patient information will be entered into a reminder system with a target due date for the next mammogram.

## 2023-05-13 IMAGING — MR MRI THORACIC SPINE WITH AND  WITHOUT CONTRAST
6 of 11 series · 29 of 48 positions shown · IV contrast (agent unspecified)
Comparison: None.

FINAL REPORT:
EXAM: MRI CERVICAL SPINE WITH AND WITHOUT CONTRAST, MRI LUMBAR SPINE WITH AND WITHOUT CONTRAST, MRI THORACIC SPINE WITH AND  WITHOUT CONTRAST
CLINICAL INDICATION:  Atypical facial pain.
TECHNIQUE: Pre-contrast sagittal T1-, T2-, and T2-w fat-saturated images, and axial T1-w and T2-w images of the cervical, thoracic, and lumbar spine. Post-contrast axial T1-w and sagittal T1-w fat-saturated images. Intravenous contrast material was administered for the examination.

[Series 13: T2 · sagittal · 4.0mm · 0.76mm/px · 3 of 15 slices shown (1 of 3)]
[im 1/15]
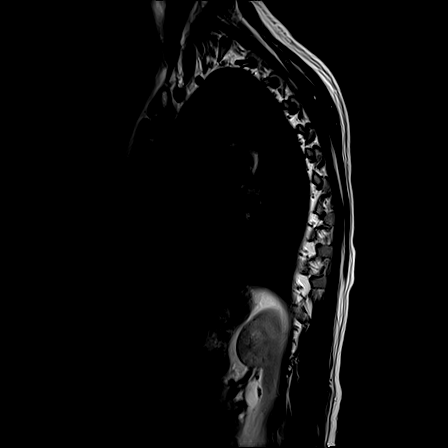
[im 8/15]
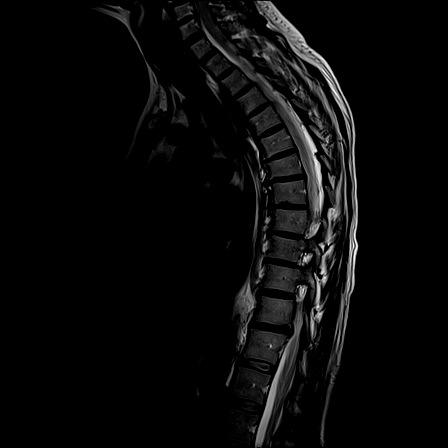
[im 15/15]
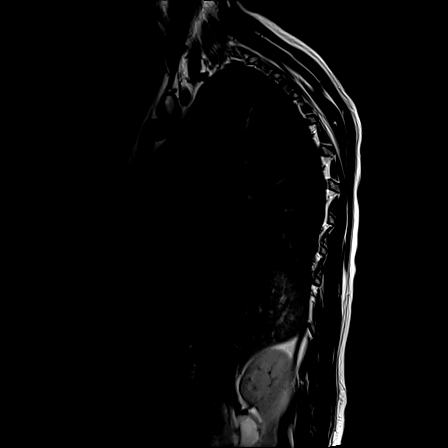

[Series 14: T1 · sagittal · 4.0mm · 1.06mm/px · 3 of 15 slices shown (1 of 3)]
[im 1/15]
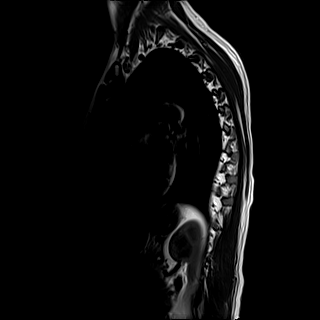
[im 8/15]
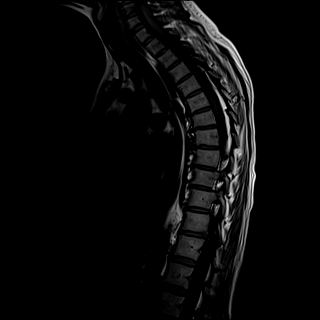
[im 15/15]
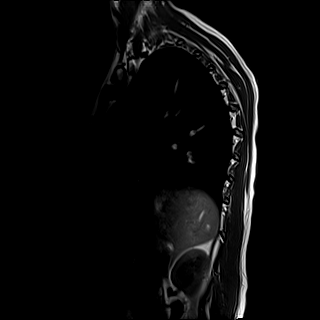

[Series 16: T2 · axial · 4.0mm · 0.78mm/px · z∈[-58,+111]mm · 6 of 40 slices shown (2 of 3)]
[im 1/40]
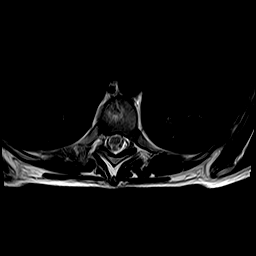
[im 8/40]
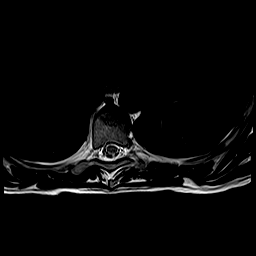
[im 16/40]
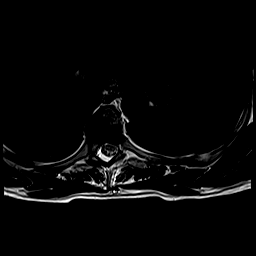
[im 24/40]
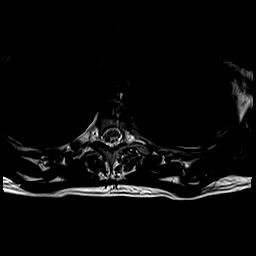
[im 32/40]
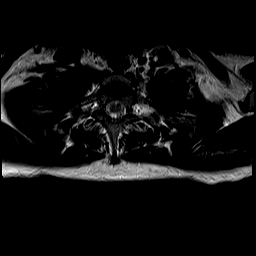
[im 40/40]
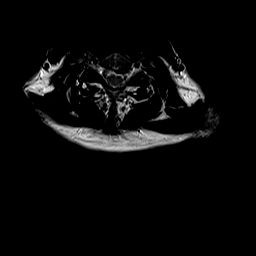

[Series 17: T2 · axial · 4.0mm · 0.78mm/px · z∈[-159,-2]mm · 6 of 37 slices shown (3 of 3)]
[im 1/37]
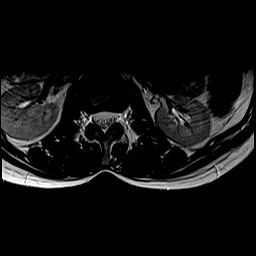
[im 8/37]
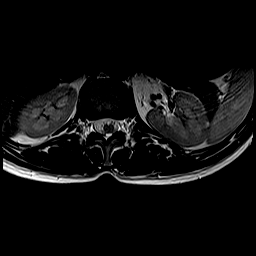
[im 15/37]
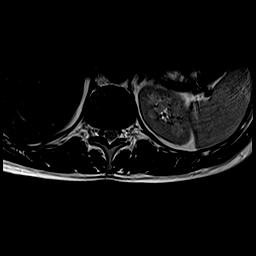
[im 22/37]
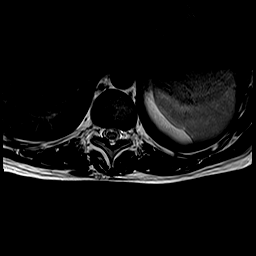
[im 29/37]
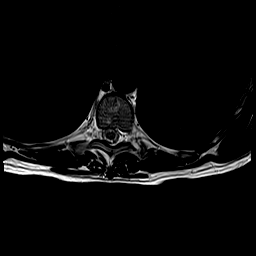
[im 37/37]
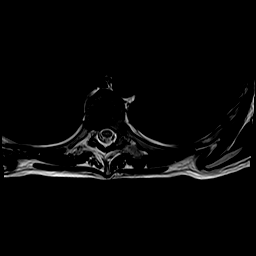

[Series 18: T1 · axial · non-contrast · 4.0mm · 0.39mm/px · z∈[-58,+111]mm · 6 of 40 slices shown (2 of 3)]
[im 1/40]
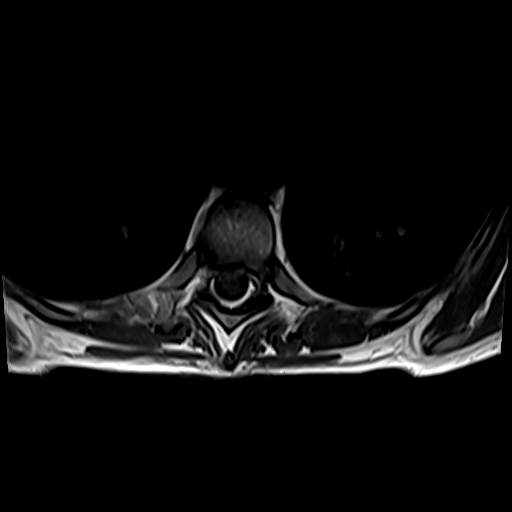
[im 8/40]
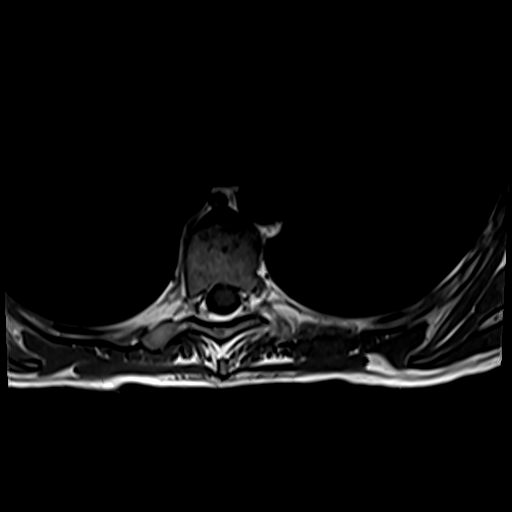
[im 16/40]
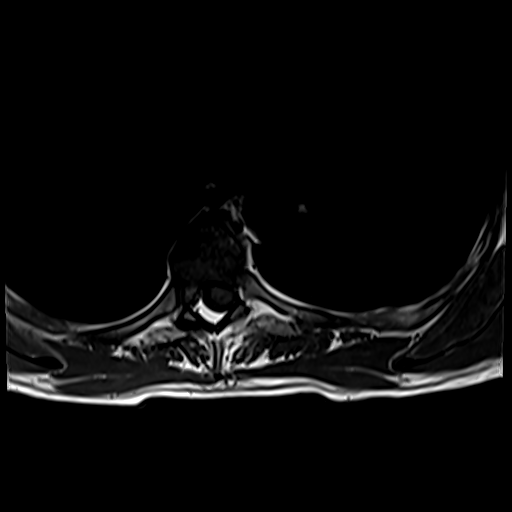
[im 24/40]
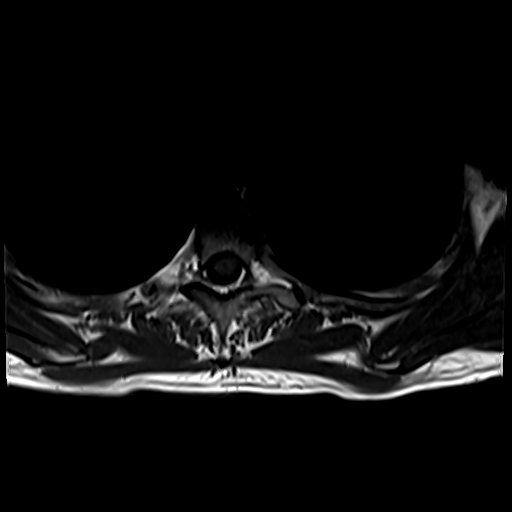
[im 32/40]
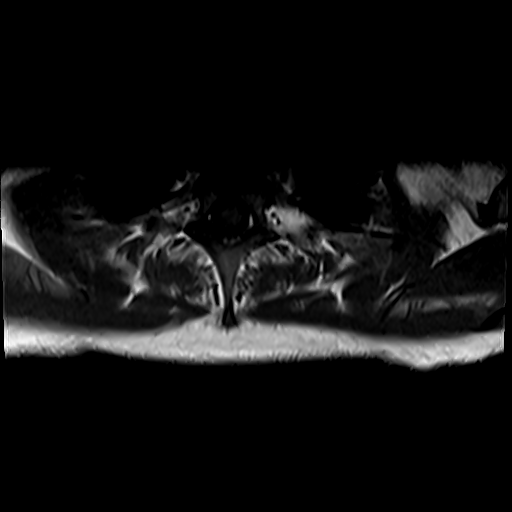
[im 40/40]
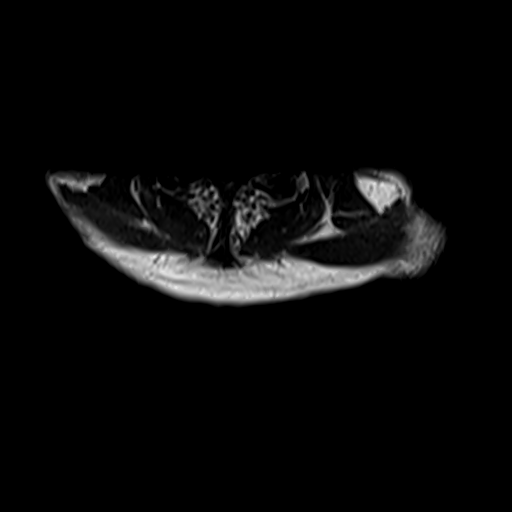

[Series 19: T1 · axial · non-contrast · 4.0mm · 0.39mm/px · z∈[-159,-37]mm · 5 of 37 slices shown (3 of 3)]
[im 1/37]
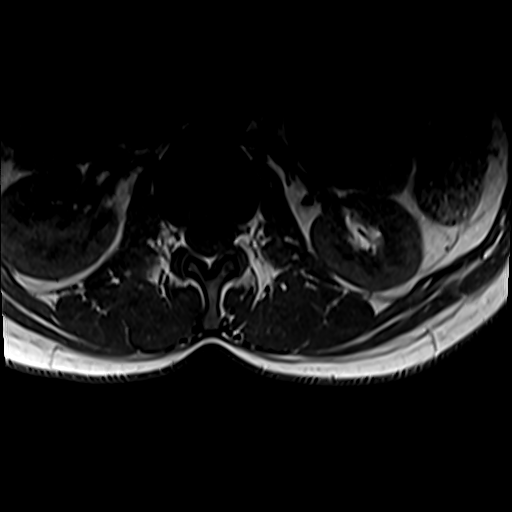
[im 8/37]
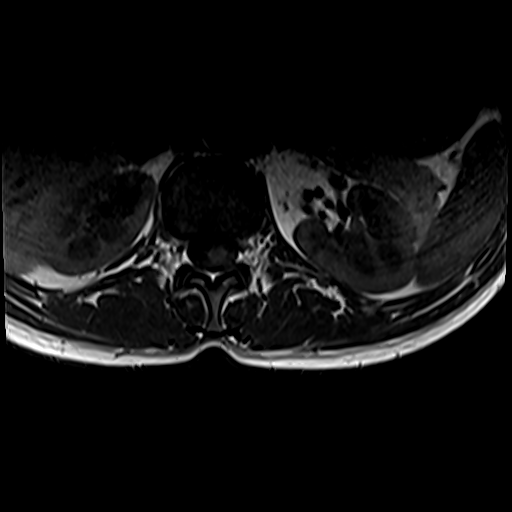
[im 15/37]
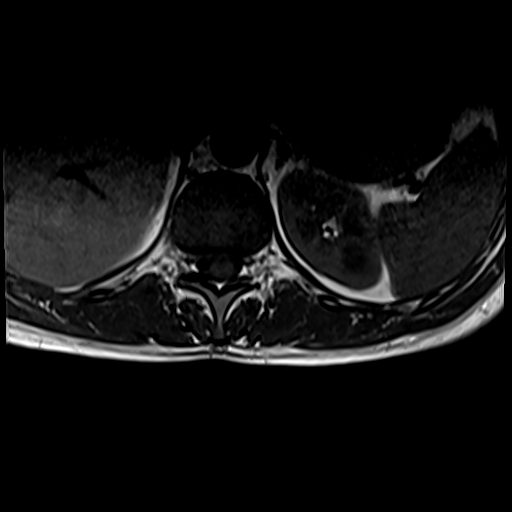
[im 22/37]
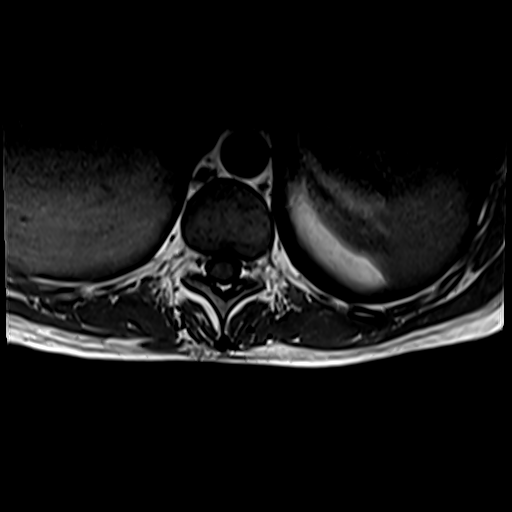
[im 29/37]
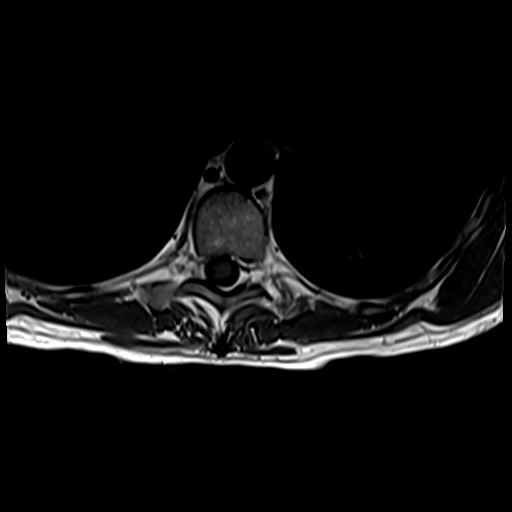

[29 of 48 positions shown; findings below may reference images not displayed]

FINDINGS: CERVICAL SPINE:
Alignment: Unremarkable
Vertebral Bodies: No significant vertebral body height loss.
Marrow Signal: Degenerative endplate changes at C5-C6.
Intervertebral Discs: Multilevel disc dessication with loss of disc space height
Spinal Cord: No abnormal cord signal.
Included Intracranial Structures: Unremarkable
Paraspinal Soft Tissues: Unremarkable
Individual Levels: Mild multilevel degenerative changes of the cervical spine with up to moderate spinal canal stenosis at C5-C6 secondary to disc osteophyte complex and facet arthropathy. No high-grade neuroforaminal stenosis.
Abnormal Enhancement: None
THORACIC SPINE:
Alignment: Unremarkable
Vertebral Bodies: No significant vertebral body height loss
Marrow Signal: Expected
Intervertebral Discs: Multilevel disc dessication with loss of disc space height
Spinal cord: No abnormal cord signal
Paraspinal Soft Tissues: Unremarkable
Individual Levels: Moderate spinal canal stenosis at T11-T12 secondary to disc bulge and facet arthropathy. No neuroforaminal stenosis.
Abnormal Enhancement: None
LUMBAR SPINE:
Alignment: Unremarkable
Vertebral Bodies: No significant vertebral body height loss
Marrow Signal: Expected
Intervertebral Discs: Multilevel disc dessication without loss of disc space height
Conus Medullaris:  Terminates at L1-L2
Cauda Equina: Unremarkable
Paraspinal Soft Tissues: Unremarkable
Individual Levels:
T12-L1: Normal
L1-L2: Normal
L2-L3:  Normal
L3-L4:  Normal
L4-L5: Circumferential disc bulge and facet arthropathy. No spinal canal stenosis. Mild bilateral neuroforaminal stenosis.
L5-S1:  Circumferential disc bulge and facet arthropathy. No spinal canal stenosis. Mild bilateral neuroforaminal stenosis.
Abnormal Enhancement: None
IMPRESSION: 1.  No abnormal cord signal or enhancement to suggest a demyelinating disorder.
2.  Mild multilevel degenerative changes of the total spine without high-grade spinal canal or neuroforaminal stenosis described above.
Is the patient pregnant?
Unknown

## 2023-05-13 IMAGING — MR MRI CERVICAL SPINE WITH AND WITHOUT CONTRAST
14 series · 48 of 48 positions shown · IV contrast (agent unspecified)
Comparison: None.

FINAL REPORT:
EXAM: MRI CERVICAL SPINE WITH AND WITHOUT CONTRAST, MRI LUMBAR SPINE WITH AND WITHOUT CONTRAST, MRI THORACIC SPINE WITH AND  WITHOUT CONTRAST
CLINICAL INDICATION:  Atypical facial pain.
TECHNIQUE: Pre-contrast sagittal T1-, T2-, and T2-w fat-saturated images, and axial T1-w and T2-w images of the cervical, thoracic, and lumbar spine. Post-contrast axial T1-w and sagittal T1-w fat-saturated images. Intravenous contrast material was administered for the examination.

[Series 1: survey · coronal · 1.7mm · 1.67mm/px · 10 of 96 slices shown (1 of 2)]
[im 1/96]
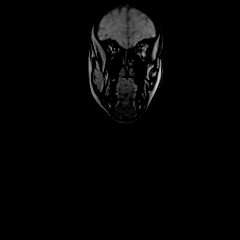
[im 11/96]
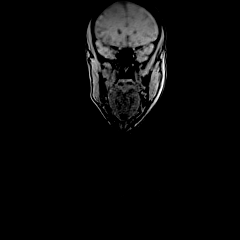
[im 22/96]
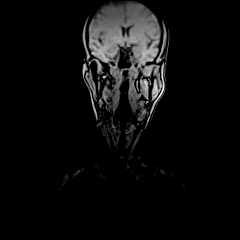
[im 32/96]
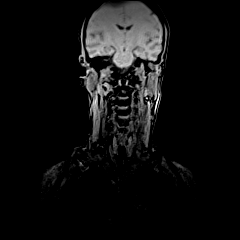
[im 43/96]
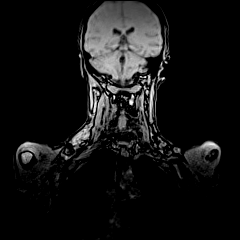
[im 53/96]
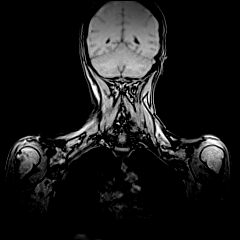
[im 64/96]
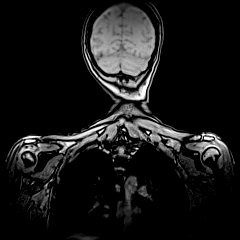
[im 74/96]
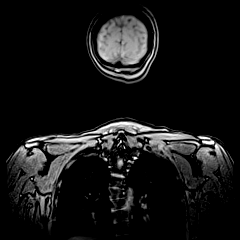
[im 85/96]
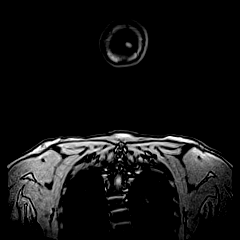
[im 96/96]
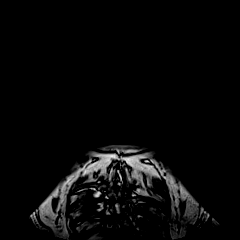

[Series 3: survey_mpr_sag · sagittal · 1.7mm · 1.67mm/px · 1 of 15 slices shown (1 of 2)]
[im 1/15]
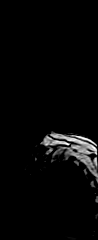

[Series 4: survey_mpr_(person_name) · axial · 1.7mm · 1.67mm/px · 1 of 7 slices shown (1 of 2)]
[im 1/7]
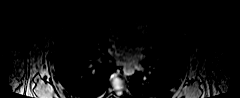

[Series 5: STIR · sagittal · 3.0mm · 0.86mm/px · 2 of 15 slices shown]
[im 1/15]
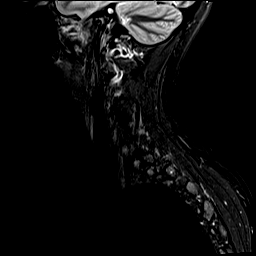
[im 15/15]
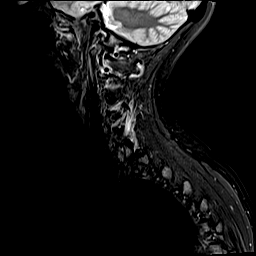

[Series 6: T2 · sagittal · 3.0mm · 0.69mm/px · 2 of 15 slices shown (1 of 2)]
[im 1/15]
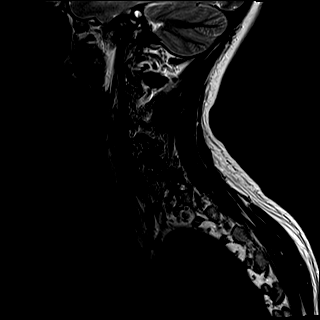
[im 15/15]
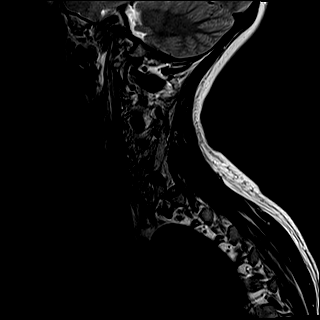

[Series 7: T1 · sagittal · 3.0mm · 0.62mm/px · 2 of 15 slices shown (1 of 2)]
[im 1/15]
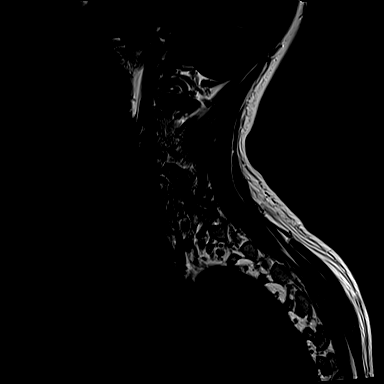
[im 15/15]
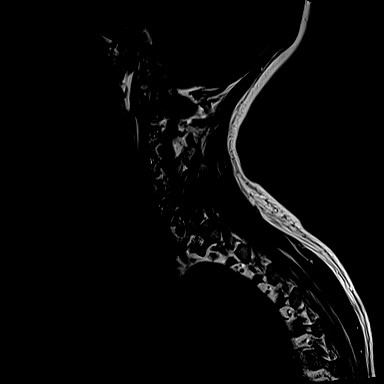

[Series 8: T2 · axial · 2.0mm · 0.70mm/px · z∈[-60,+38]mm · 6 of 52 slices shown (2 of 2)]
[im 1/52]
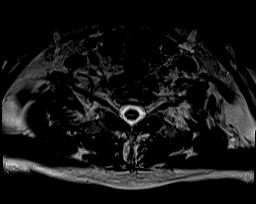
[im 11/52]
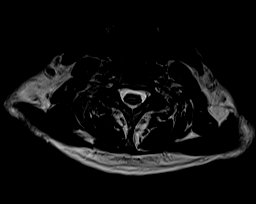
[im 21/52]
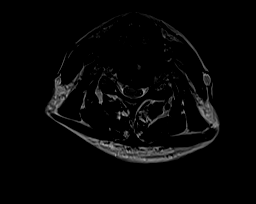
[im 31/52]
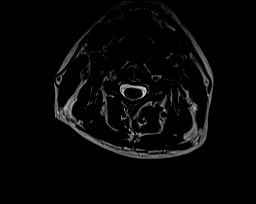
[im 41/52]
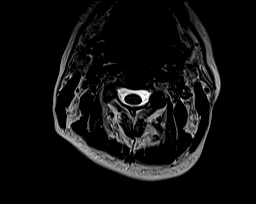
[im 52/52]
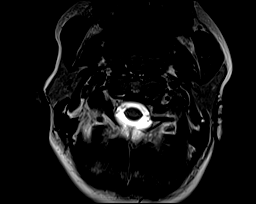

[Series 9: T1 · axial · non-contrast · 4.0mm · 0.47mm/px · z∈[-64,+21]mm · 2 of 21 slices shown (2 of 2)]
[im 1/21]
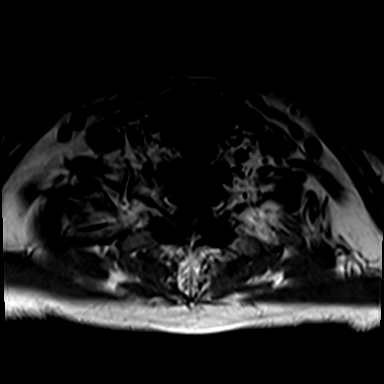
[im 21/21]
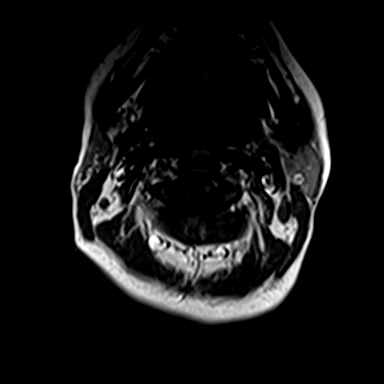

[Series 10: survey · coronal · 1.7mm · 1.67mm/px · 11 of 96 slices shown (2 of 2)]
[im 1/96]
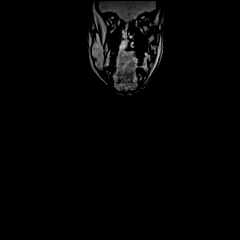
[im 10/96]
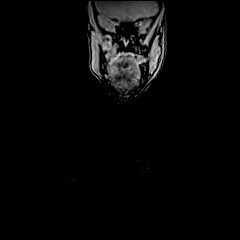
[im 20/96]
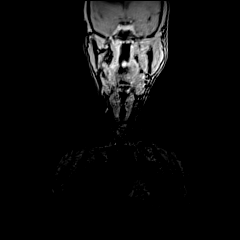
[im 29/96]
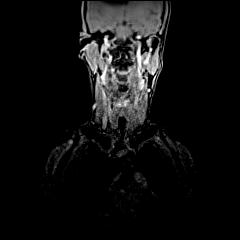
[im 39/96]
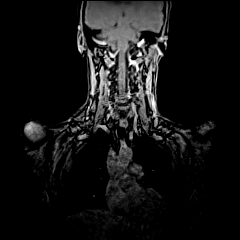
[im 48/96]
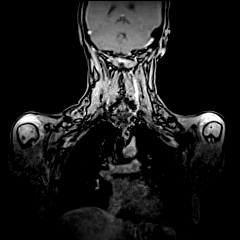
[im 58/96]
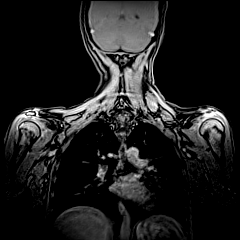
[im 67/96]
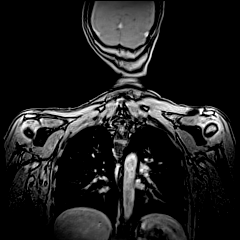
[im 77/96]
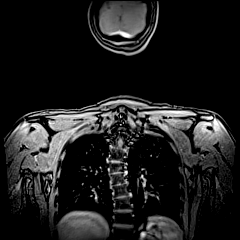
[im 86/96]
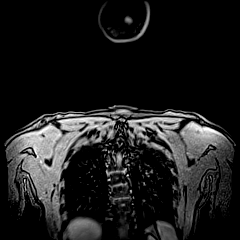
[im 96/96]
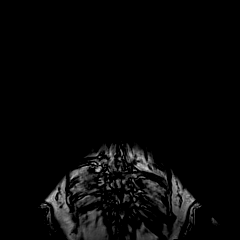

[Series 12: survey_mpr_sag · sagittal · 1.7mm · 1.67mm/px · 2 of 15 slices shown (2 of 2)]
[im 1/15]
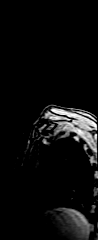
[im 15/15]
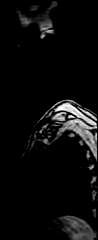

[Series 13: survey_mpr_(person_name) · axial · 1.7mm · 1.67mm/px · 1 of 7 slices shown (2 of 2)]
[im 1/7]
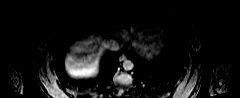

[Series 14: T1 post-contrast · sagittal · 3.0mm · 0.43mm/px · 2 of 15 slices shown (1 of 2)]
[im 1/15]
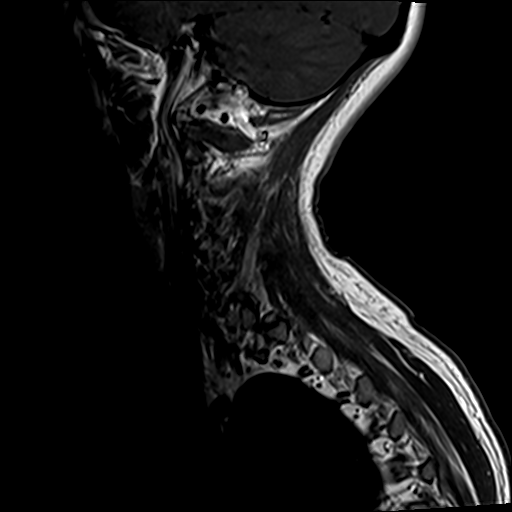
[im 15/15]
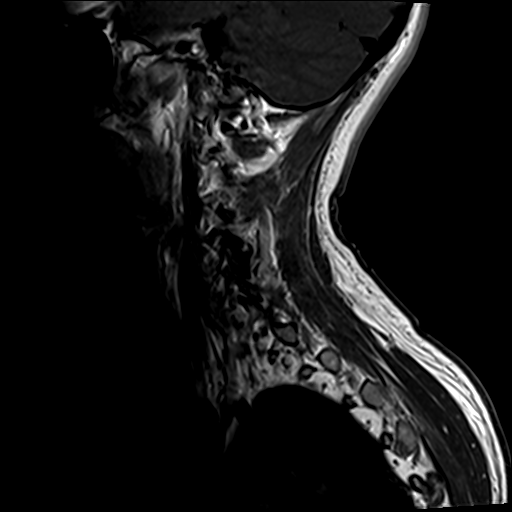

[Series 15: T1 fat-sat post-contrast · sagittal · 3.0mm · 0.57mm/px · 2 of 15 slices shown]
[im 1/15]
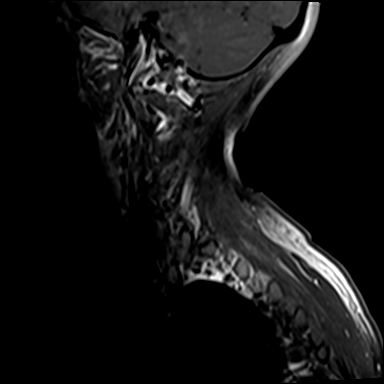
[im 15/15]
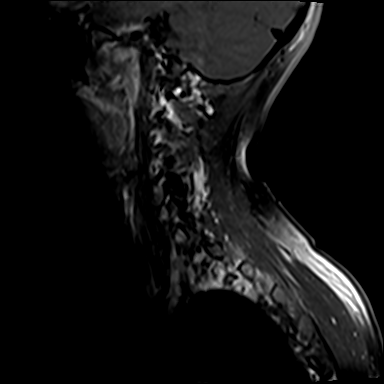

[Series 16: T1 post-contrast · axial · 3.0mm · 0.47mm/px · z∈[-39,+73]mm · 4 of 37 slices shown (2 of 2)]
[im 1/37]
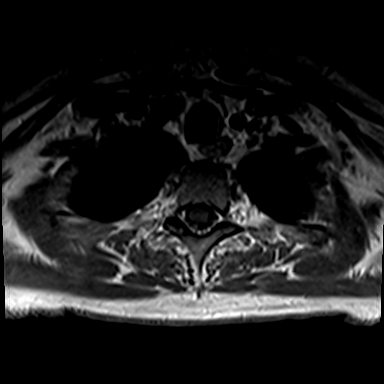
[im 13/37]
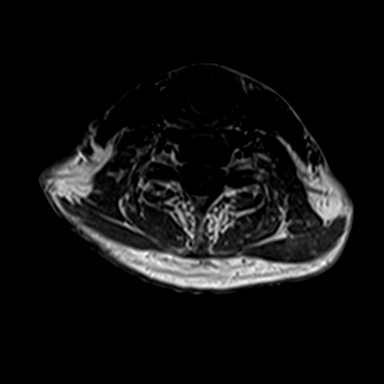
[im 25/37]
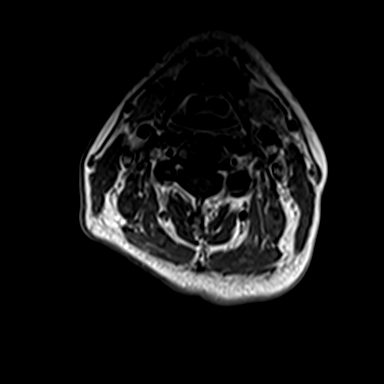
[im 37/37]
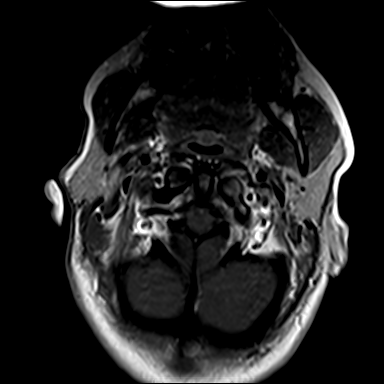

[48 of 48 positions shown; findings below may reference images not displayed]

FINDINGS: CERVICAL SPINE:
Alignment: Unremarkable
Vertebral Bodies: No significant vertebral body height loss.
Marrow Signal: Degenerative endplate changes at C5-C6.
Intervertebral Discs: Multilevel disc dessication with loss of disc space height
Spinal Cord: No abnormal cord signal.
Included Intracranial Structures: Unremarkable
Paraspinal Soft Tissues: Unremarkable
Individual Levels: Mild multilevel degenerative changes of the cervical spine with up to moderate spinal canal stenosis at C5-C6 secondary to disc osteophyte complex and facet arthropathy. No high-grade neuroforaminal stenosis.
Abnormal Enhancement: None
THORACIC SPINE:
Alignment: Unremarkable
Vertebral Bodies: No significant vertebral body height loss
Marrow Signal: Expected
Intervertebral Discs: Multilevel disc dessication with loss of disc space height
Spinal cord: No abnormal cord signal
Paraspinal Soft Tissues: Unremarkable
Individual Levels: Moderate spinal canal stenosis at T11-T12 secondary to disc bulge and facet arthropathy. No neuroforaminal stenosis.
Abnormal Enhancement: None
LUMBAR SPINE:
Alignment: Unremarkable
Vertebral Bodies: No significant vertebral body height loss
Marrow Signal: Expected
Intervertebral Discs: Multilevel disc dessication without loss of disc space height
Conus Medullaris:  Terminates at L1-L2
Cauda Equina: Unremarkable
Paraspinal Soft Tissues: Unremarkable
Individual Levels:
T12-L1: Normal
L1-L2: Normal
L2-L3:  Normal
L3-L4:  Normal
L4-L5: Circumferential disc bulge and facet arthropathy. No spinal canal stenosis. Mild bilateral neuroforaminal stenosis.
L5-S1:  Circumferential disc bulge and facet arthropathy. No spinal canal stenosis. Mild bilateral neuroforaminal stenosis.
Abnormal Enhancement: None
IMPRESSION: 1.  No abnormal cord signal or enhancement to suggest a demyelinating disorder.
2.  Mild multilevel degenerative changes of the total spine without high-grade spinal canal or neuroforaminal stenosis described above.
Is the patient pregnant?
Unknown

## 2023-05-13 IMAGING — MR MRI BRAIN WITH AND WITHOUT CONTRAST
14 of 21 series · 36 of 48 positions shown · IV contrast (gadolinium)
Comparison: None.

FINAL REPORT:
MRI BRAIN WITH AND WITHOUT CONTRAST
INDICATION: Atypical facial pain
TECHNIQUE: Multiplanar multisequence magnetic resonance imaging of the brain was performed with and without gadolinium based contrast

[Series 1: survey · sagittal · 1.6mm · 1.62mm/px · 5 of 128 slices shown]
[im 1/128]
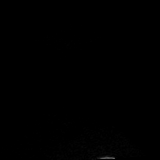
[im 32/128]
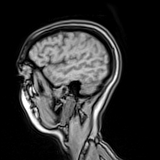
[im 64/128]
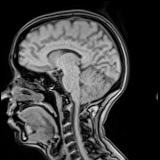
[im 96/128]
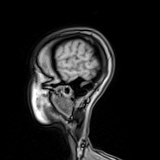
[im 128/128]
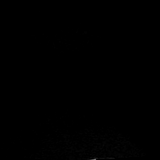

[Series 2: survey_mpr_sag · sagittal · 1.6mm · 1.60mm/px · 1 of 5 slices shown]
[im 1/5]
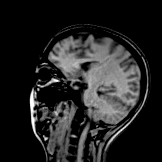

[Series 3: survey_mpr_cor · coronal · 1.6mm · 1.60mm/px · 1 of 3 slices shown]
[im 1/3]
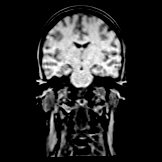

[Series 4: survey_mpr_(person_name) · axial · 1.6mm · 1.60mm/px · 1 of 3 slices shown]
[im 1/3]
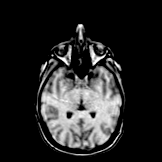

[Series 8: T1 · sagittal · 4.0mm · 0.72mm/px · 1 of 27 slices shown (1 of 4)]
[im 1/27]
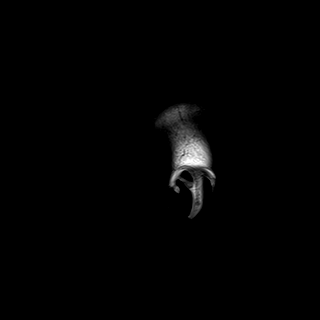

[Series 9: FLAIR · axial · 4.0mm · 0.72mm/px · 1 of 32 slices shown (1 of 2)]
[im 1/32]
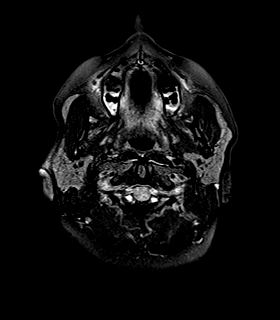

[Series 10: T1 · axial · 4.0mm · 0.72mm/px · 1 of 32 slices shown (2 of 4)]
[im 1/32]
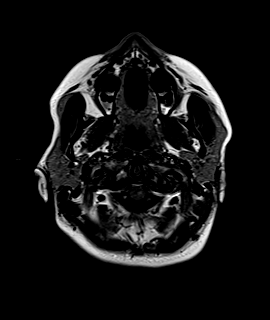

[Series 11: GRE · axial · 4.0mm · 0.45mm/px · 1 of 32 slices shown]
[im 1/32]
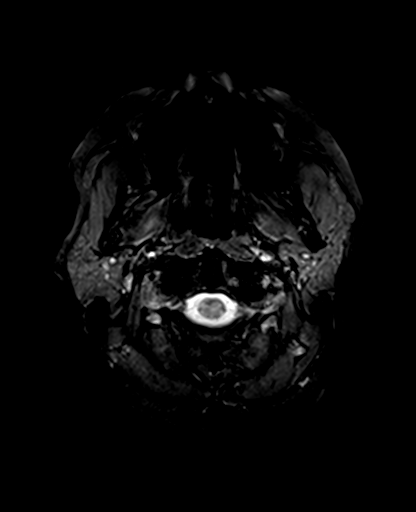

[Series 12: T2 fat-sat · axial · 4.0mm · 0.51mm/px · 1 of 32 slices shown]
[im 1/32]
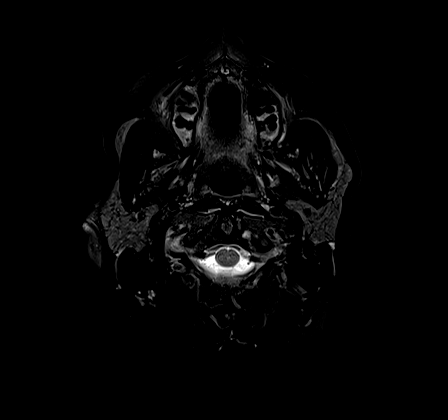

[Series 13: FLAIR · sagittal · 4.0mm · 0.72mm/px · 1 of 27 slices shown (2 of 2)]
[im 1/27]
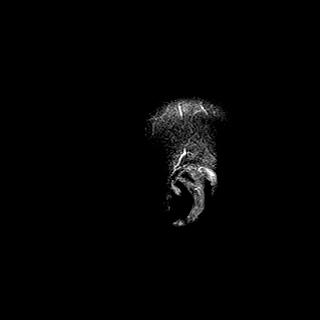

[Series 18: T1 post-contrast · axial · 4.0mm · 0.72mm/px · 1 of 32 slices shown (1 of 2)]
[im 1/32]
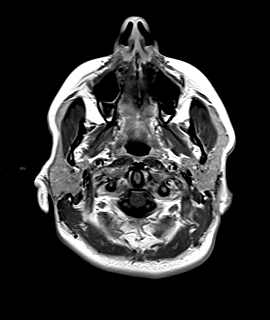

[Series 19: T1 post-contrast · axial · 1.0mm · 0.97mm/px · z∈[-88,+87]mm · 8 of 176 slices shown (2 of 2)]
[im 1/176]
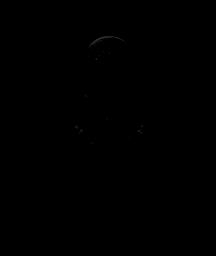
[im 26/176]
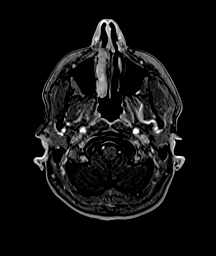
[im 51/176]
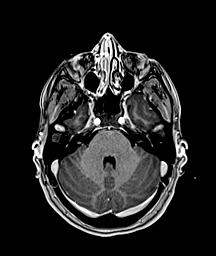
[im 76/176]
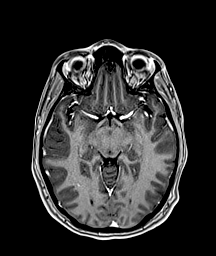
[im 101/176]
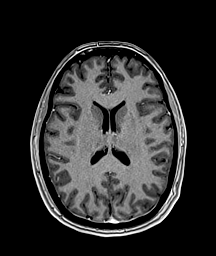
[im 126/176]
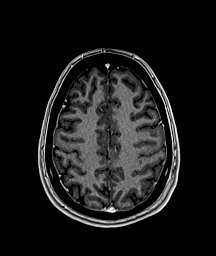
[im 151/176]
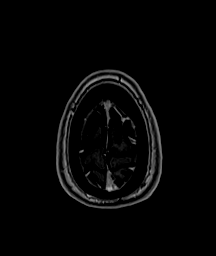
[im 176/176]
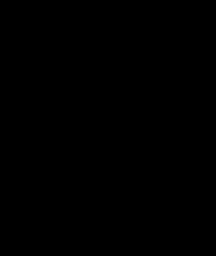

[Series 20: T1 · sagittal · 1.0mm · 0.45mm/px · 6 of 135 slices shown (3 of 4)]
[im 1/135]
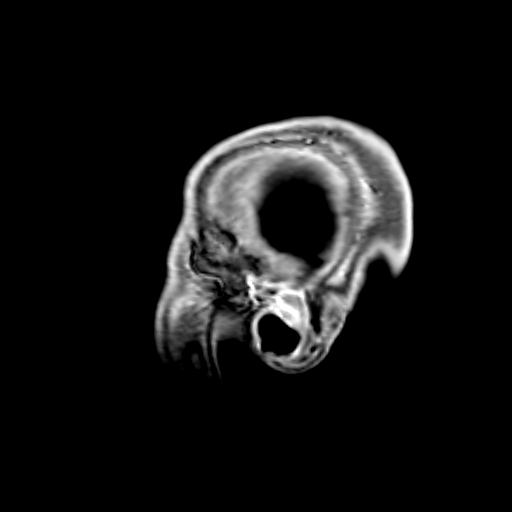
[im 27/135]
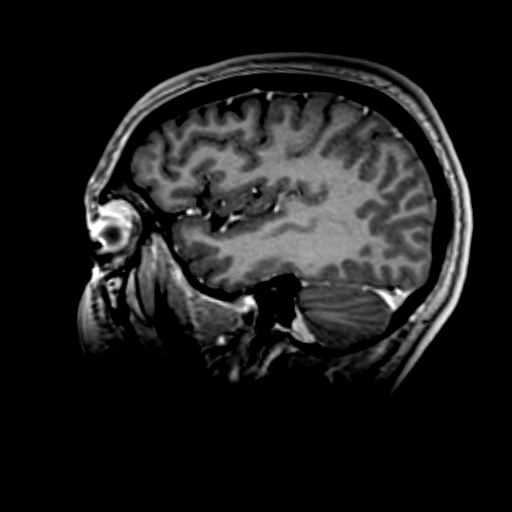
[im 54/135]
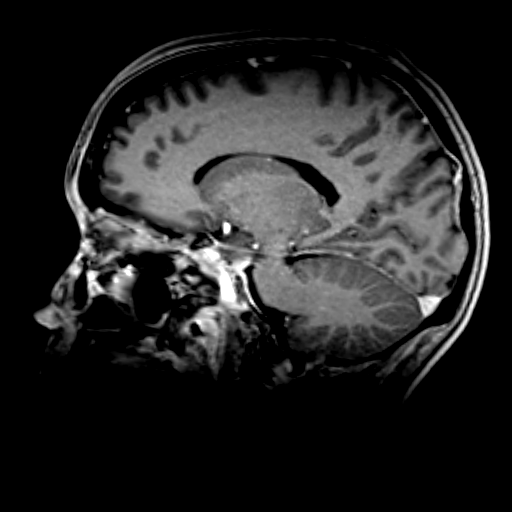
[im 81/135]
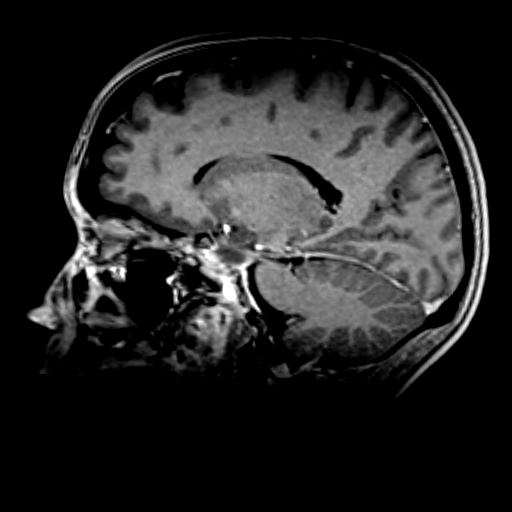
[im 108/135]
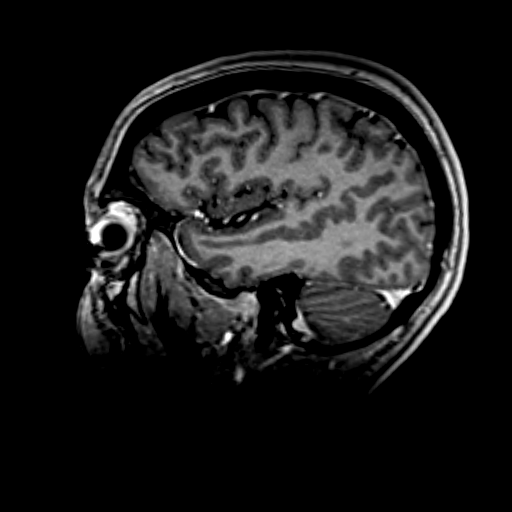
[im 135/135]
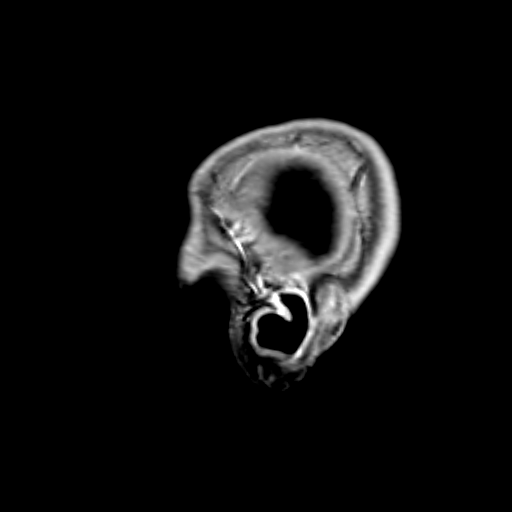

[Series 21: T1 · coronal · 1.0mm · 0.45mm/px · 7 of 152 slices shown (4 of 4)]
[im 1/152]
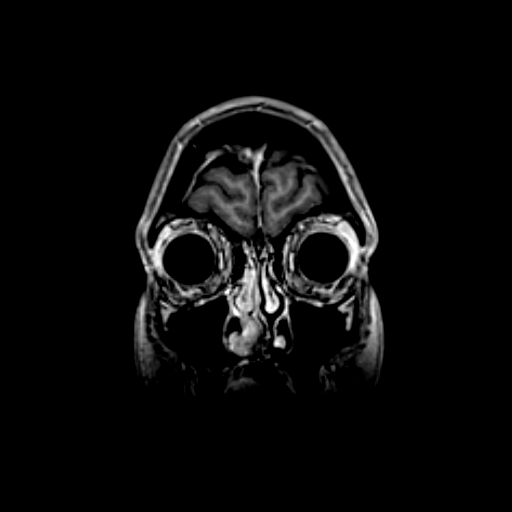
[im 26/152]
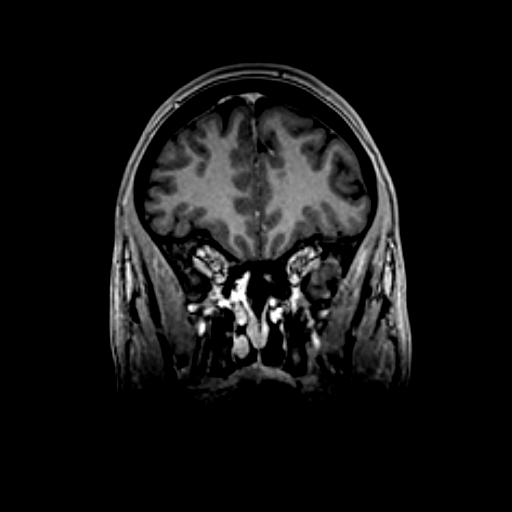
[im 51/152]
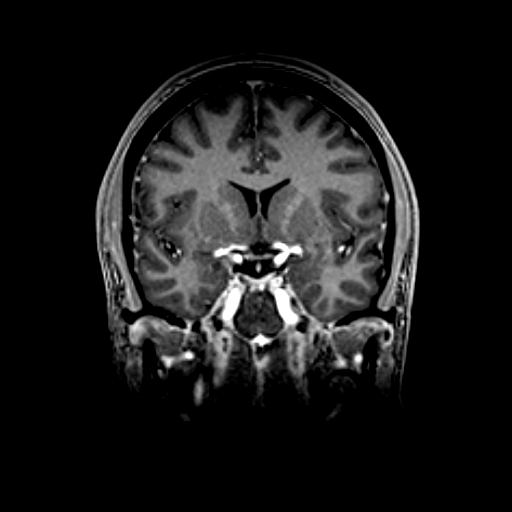
[im 76/152]
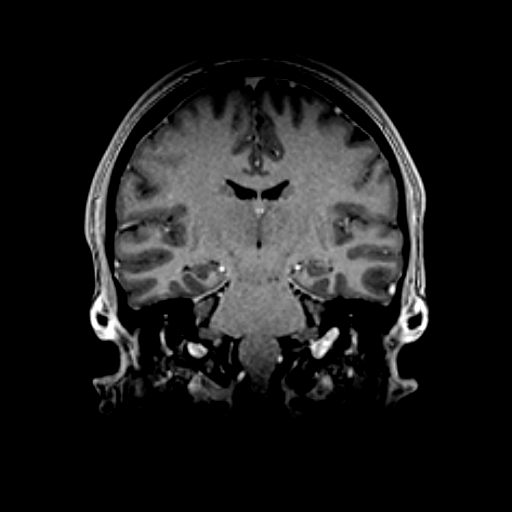
[im 101/152]
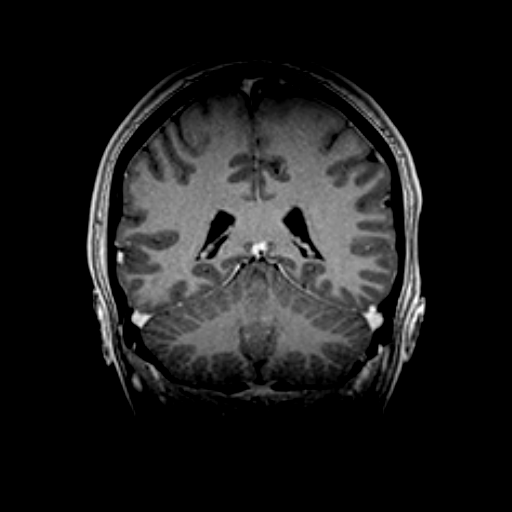
[im 126/152]
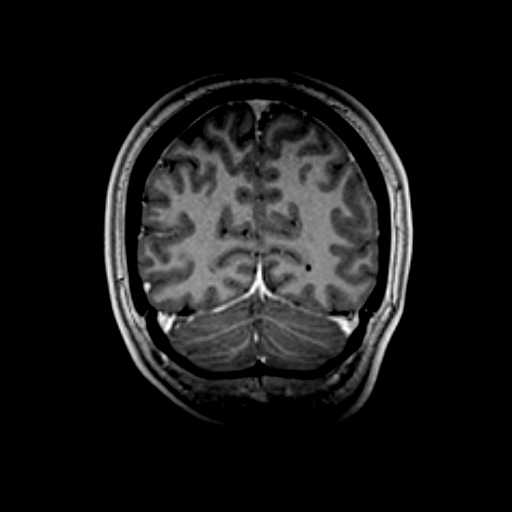
[im 152/152]
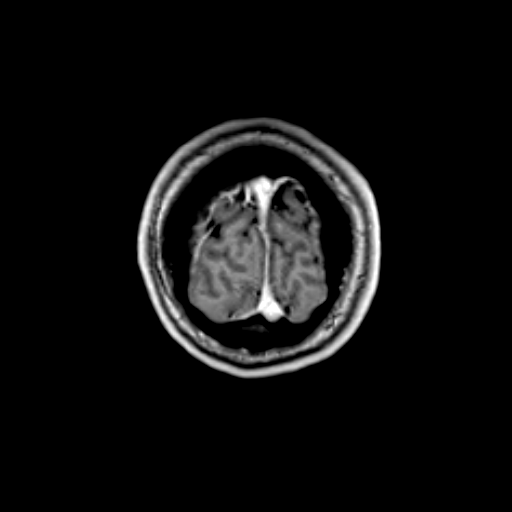

[36 of 48 positions shown; findings below may reference images not displayed]

FINDINGS: Parenchyma: No infarction on DWI. No hemorrhage. No mass or mass effect.
Abnormal Enhancement: None
Extra-axial Collection:  None
Ventricular System: Normal
Major Intracranial Flow Voids: Normal
Osseous Structures:  Expected marrow signal.
Included Orbits: Normal
Paranasal Sinuses:  Predominantly clear
Tympanomastoid Cavities:  Normal
IMPRESSION: No acute intracranial abnormality. No abnormal enhancement.
Is the patient pregnant?
Unknown

## 2023-05-13 IMAGING — MR MRI LUMBAR SPINE WITH AND WITHOUT CONTRAST
11 of 17 series · 22 of 48 positions shown · IV contrast (agent unspecified)
Comparison: None.

FINAL REPORT:
EXAM: MRI CERVICAL SPINE WITH AND WITHOUT CONTRAST, MRI LUMBAR SPINE WITH AND WITHOUT CONTRAST, MRI THORACIC SPINE WITH AND  WITHOUT CONTRAST
CLINICAL INDICATION:  Atypical facial pain.
TECHNIQUE: Pre-contrast sagittal T1-, T2-, and T2-w fat-saturated images, and axial T1-w and T2-w images of the cervical, thoracic, and lumbar spine. Post-contrast axial T1-w and sagittal T1-w fat-saturated images. Intravenous contrast material was administered for the examination.

[Series 5: T2 · sagittal · 4.0mm · 0.70mm/px · 1 of 14 slices shown (1 of 3)]
[im 1/14]
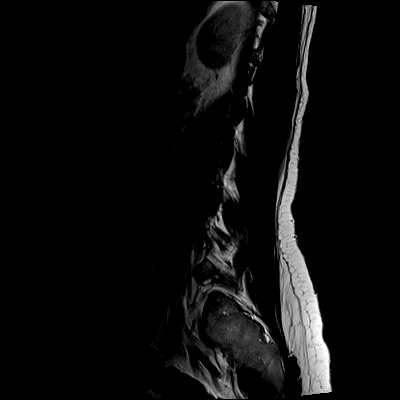

[Series 6: T1 · sagittal · 4.0mm · 0.73mm/px · 1 of 14 slices shown (1 of 3)]
[im 1/14]
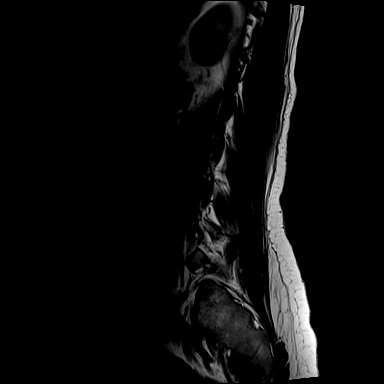

[Series 7: STIR · sagittal · 4.0mm · 0.88mm/px · 1 of 14 slices shown]
[im 1/14]
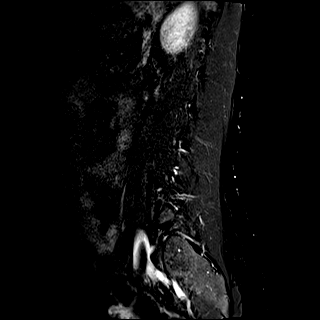

[Series 8: T2 · axial · 4.0mm · 0.69mm/px · z∈[-14,+52]mm · 2 of 17 slices shown (2 of 3)]
[im 1/17]
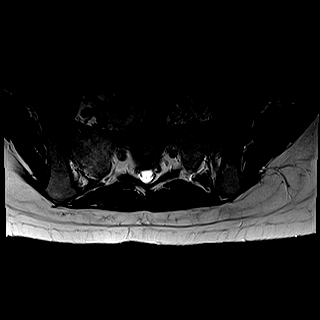
[im 17/17]
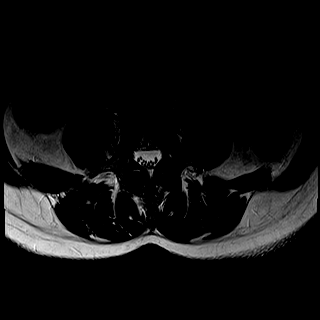

[Series 9: T2 · axial · 4.0mm · 0.69mm/px · z∈[+97,+223]mm · 3 of 30 slices shown (3 of 3)]
[im 1/30]
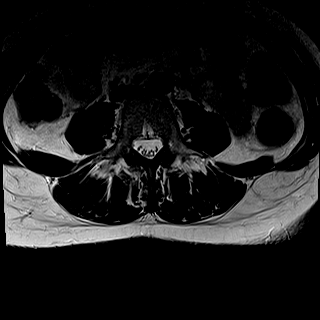
[im 15/30]
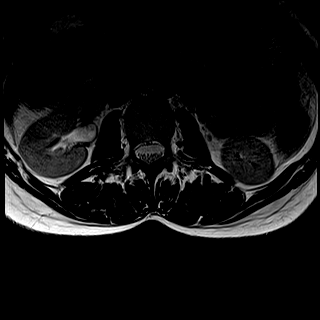
[im 30/30]
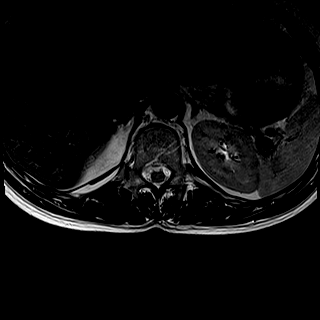

[Series 11: T1 · axial · 4.0mm · 0.69mm/px · z∈[-14,+52]mm · 2 of 17 slices shown (2 of 3)]
[im 1/17]
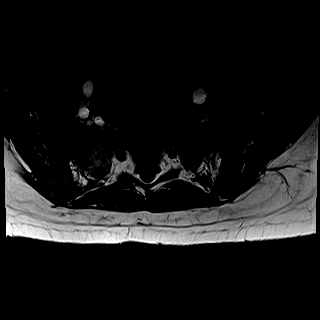
[im 17/17]
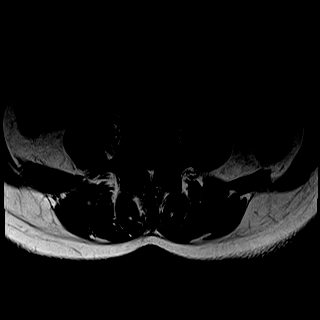

[Series 12: T1 · axial · 4.0mm · 0.69mm/px · z∈[+97,+223]mm · 3 of 30 slices shown (3 of 3)]
[im 1/30]
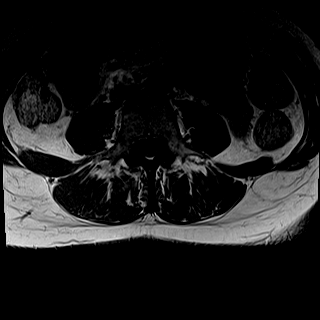
[im 15/30]
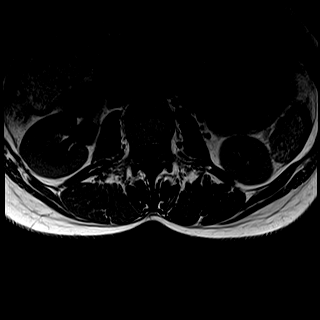
[im 30/30]
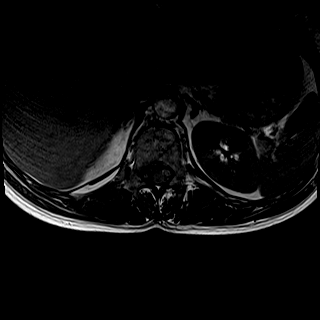

[Series 18: T1 fat-sat post-contrast · sagittal · 4.0mm · 0.88mm/px · 2 of 17 slices shown]
[im 1/17]
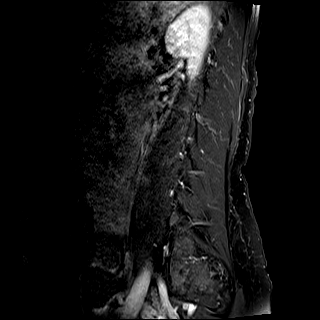
[im 17/17]
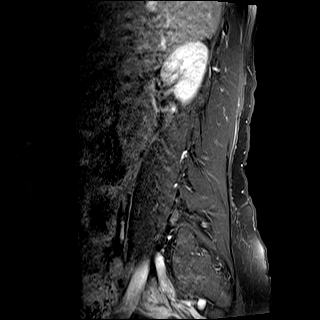

[Series 19: T1 post-contrast · sagittal · 4.0mm · 0.73mm/px · 2 of 17 slices shown (1 of 3)]
[im 1/17]
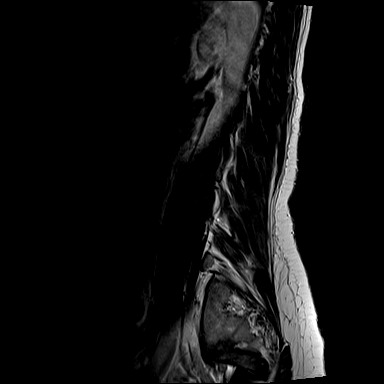
[im 17/17]
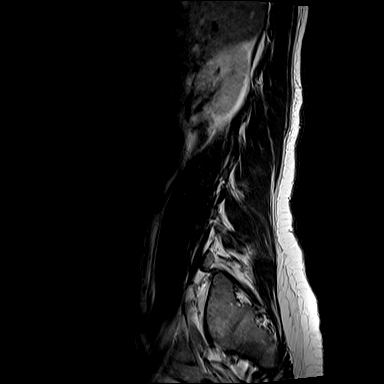

[Series 20: T1 post-contrast · axial · 4.0mm · 0.69mm/px · z∈[-68,-2]mm · 2 of 17 slices shown (2 of 3)]
[im 1/17]
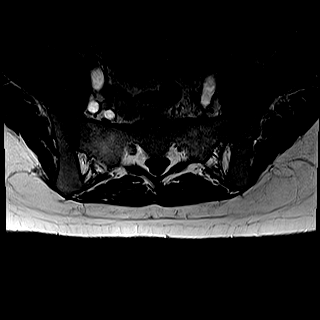
[im 17/17]
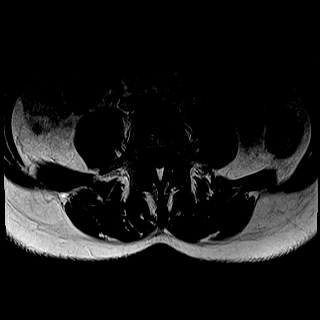

[Series 21: T1 post-contrast · axial · 4.0mm · 0.69mm/px · z∈[+46,+173]mm · 3 of 30 slices shown (3 of 3)]
[im 1/30]
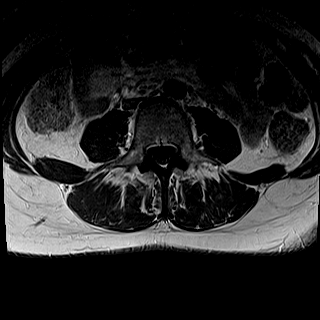
[im 15/30]
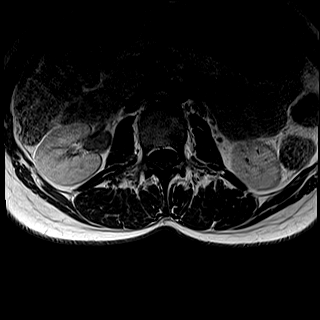
[im 30/30]
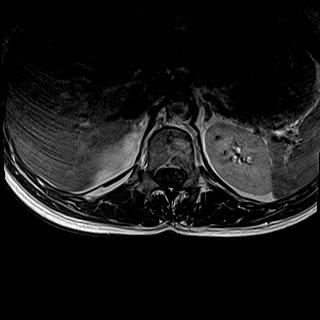

[22 of 48 positions shown; findings below may reference images not displayed]

FINDINGS: CERVICAL SPINE:
Alignment: Unremarkable
Vertebral Bodies: No significant vertebral body height loss.
Marrow Signal: Degenerative endplate changes at C5-C6.
Intervertebral Discs: Multilevel disc dessication with loss of disc space height
Spinal Cord: No abnormal cord signal.
Included Intracranial Structures: Unremarkable
Paraspinal Soft Tissues: Unremarkable
Individual Levels: Mild multilevel degenerative changes of the cervical spine with up to moderate spinal canal stenosis at C5-C6 secondary to disc osteophyte complex and facet arthropathy. No high-grade neuroforaminal stenosis.
Abnormal Enhancement: None
THORACIC SPINE:
Alignment: Unremarkable
Vertebral Bodies: No significant vertebral body height loss
Marrow Signal: Expected
Intervertebral Discs: Multilevel disc dessication with loss of disc space height
Spinal cord: No abnormal cord signal
Paraspinal Soft Tissues: Unremarkable
Individual Levels: Moderate spinal canal stenosis at T11-T12 secondary to disc bulge and facet arthropathy. No neuroforaminal stenosis.
Abnormal Enhancement: None
LUMBAR SPINE:
Alignment: Unremarkable
Vertebral Bodies: No significant vertebral body height loss
Marrow Signal: Expected
Intervertebral Discs: Multilevel disc dessication without loss of disc space height
Conus Medullaris:  Terminates at L1-L2
Cauda Equina: Unremarkable
Paraspinal Soft Tissues: Unremarkable
Individual Levels:
T12-L1: Normal
L1-L2: Normal
L2-L3:  Normal
L3-L4:  Normal
L4-L5: Circumferential disc bulge and facet arthropathy. No spinal canal stenosis. Mild bilateral neuroforaminal stenosis.
L5-S1:  Circumferential disc bulge and facet arthropathy. No spinal canal stenosis. Mild bilateral neuroforaminal stenosis.
Abnormal Enhancement: None
IMPRESSION: 1.  No abnormal cord signal or enhancement to suggest a demyelinating disorder.
2.  Mild multilevel degenerative changes of the total spine without high-grade spinal canal or neuroforaminal stenosis described above.
Is the patient pregnant?
Unknown

## 2023-09-11 IMAGING — CR XR CHEST 1 VIEW
1 series · 1 of 1 positions shown · non-contrast
Comparison: None available

FINAL REPORT:
HISTORY: Dyspnea
Number of views:1

[AP]
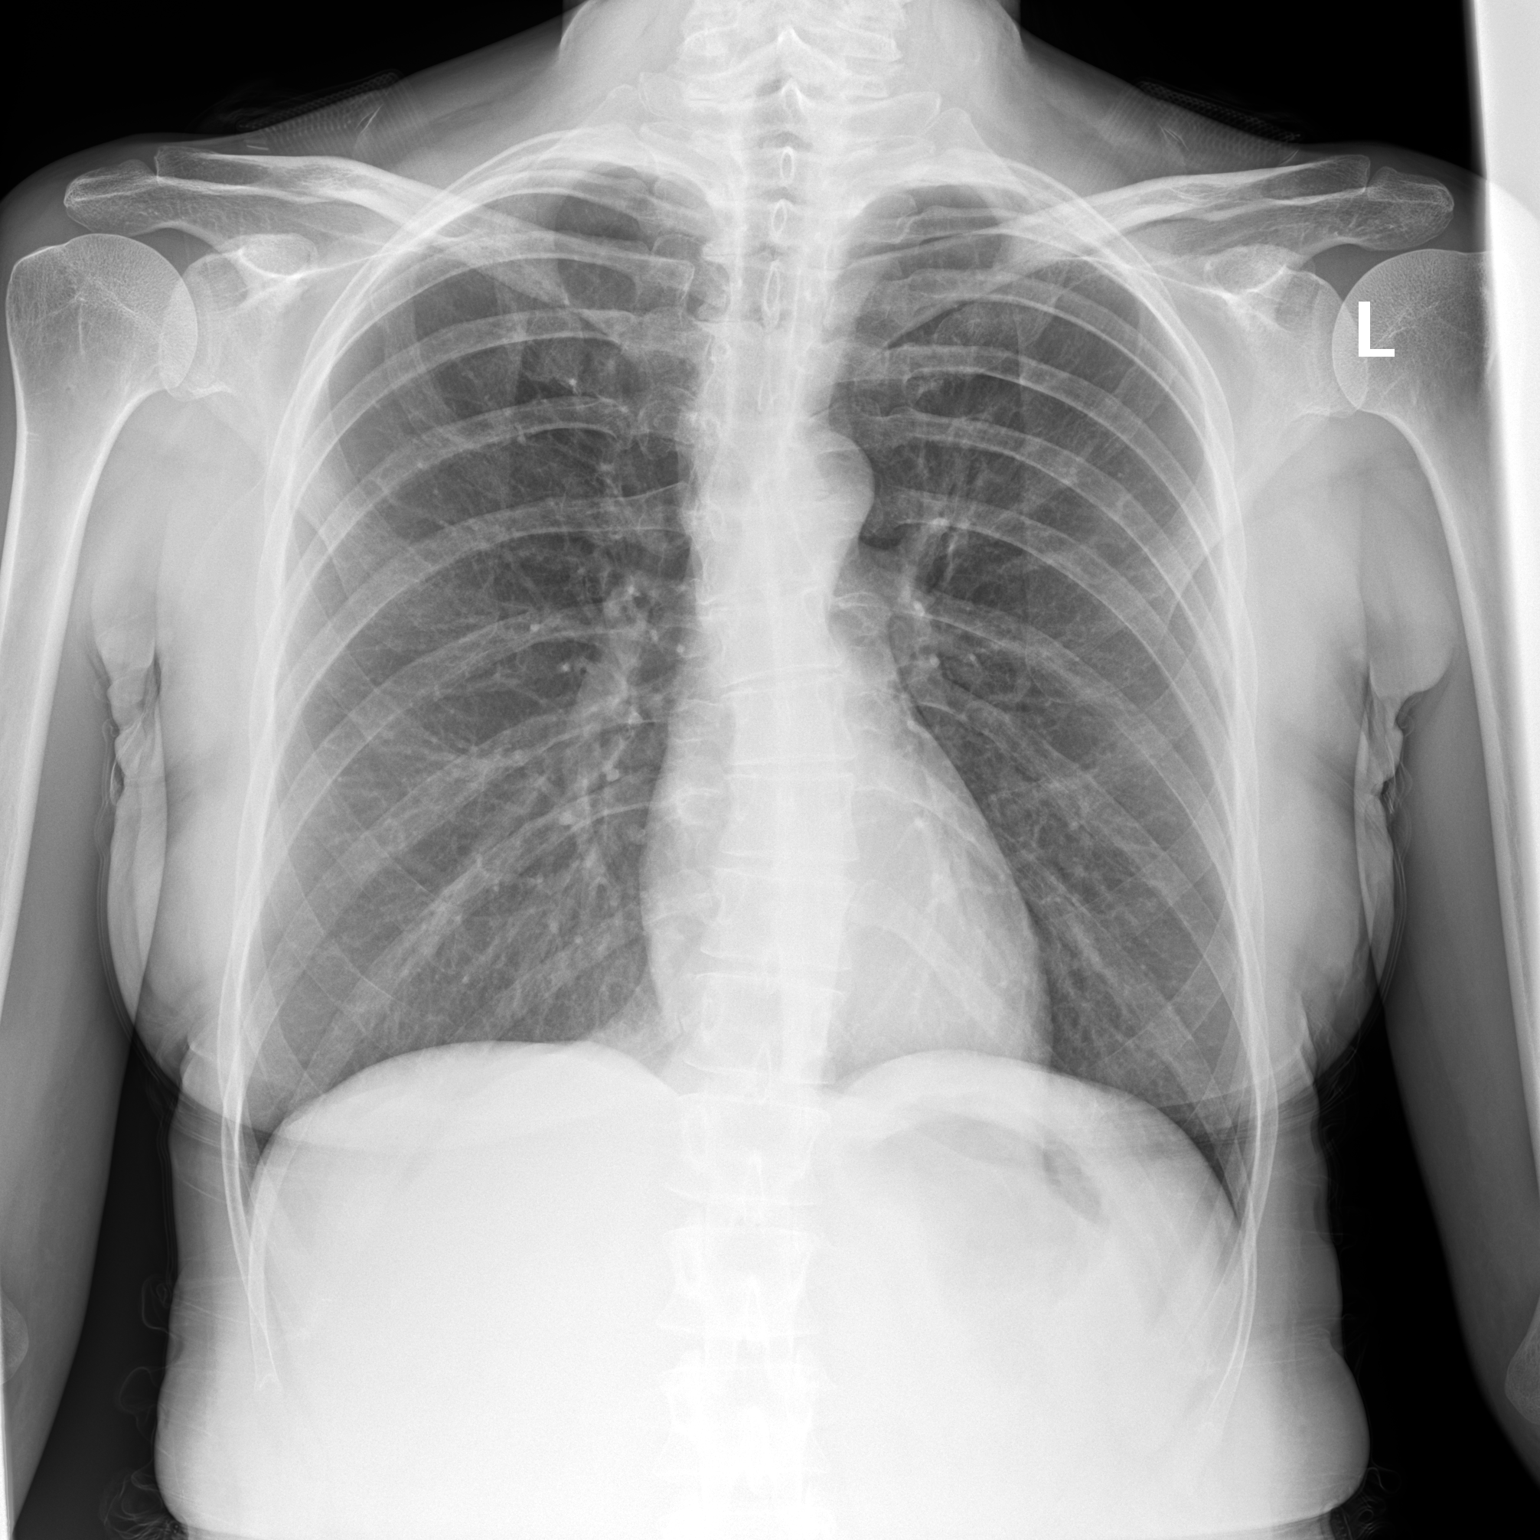

[1 of 1 positions shown; findings below may reference images not displayed]

FINDINGS: Cardiomediastinal contour is unremarkable. No aggressive bone lesions are seen. Lungs are symmetrically aerated and clear. Mild scoliosis
IMPRESSION: 
IMPRESSION: No active disease in the chest
Is the patient pregnant?
No

## 2023-09-13 IMAGING — MR MRA HEAD (BRAIN) ANGIO WITH AND WITHOUT IV CONTRAST
8 of 16 series · 36 of 48 positions shown · IV contrast (gadolinium)
Comparison: none

FINAL REPORT:
HISTORY: Venous thrombosis in setting of nephrotic syndrome.
MRA/MRV head with and without contrast.
TECHNIQUE: 3D time-of-flight MRA/MRV imaging obtained of the intracranial circulation and dural venous sinuses after the uneventful administration of IV gadolinium.

[Series 6: ax dwi_tracew · axial · 5.0mm · 0.60mm/px · 1 of 27 slices shown (1 of 2)]
[im 1/27]
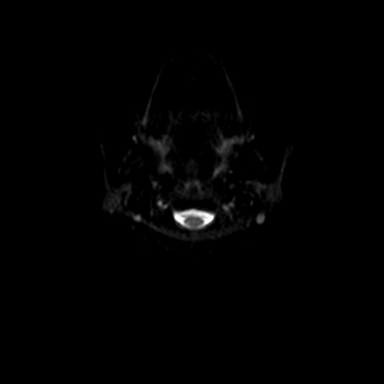

[Series 6: ax dwi_tracew · axial · 5.0mm · 0.60mm/px · 1 of 27 slices shown (2 of 2)]
[im 1/27]
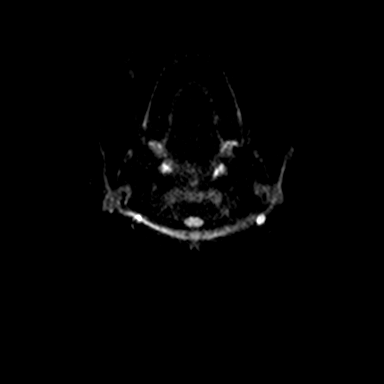

[Series 7: ax dwi_adc · axial · 5.0mm · 0.60mm/px · 1 of 27 slices shown]
[im 1/27]
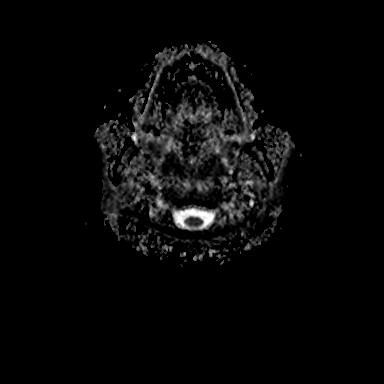

[Series 8: ax dwi_exp · axial · 5.0mm · 0.60mm/px · 1 of 27 slices shown]
[im 1/27]
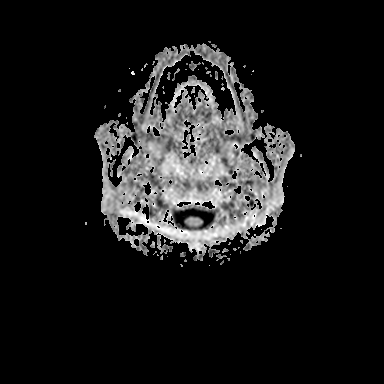

[Series 10: MRA · axial · 0.5mm · 0.39mm/px · z∈[-54,+29]mm · 8 of 168 slices shown]
[im 1/168]
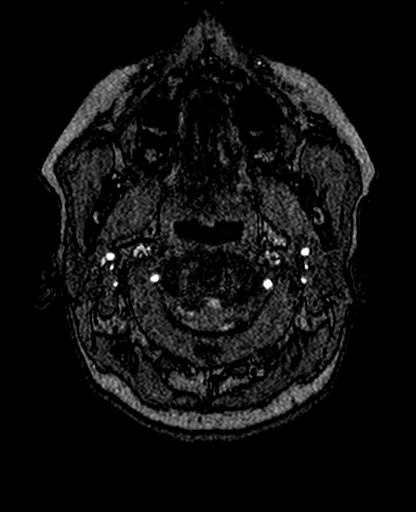
[im 24/168]
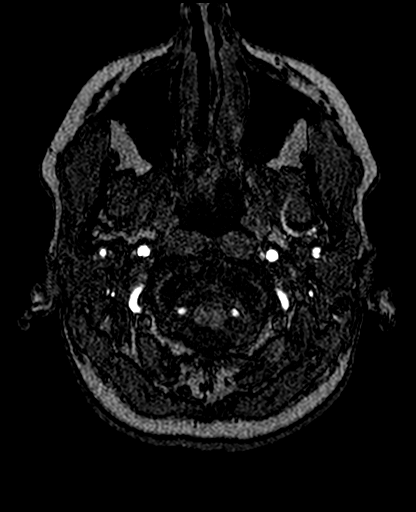
[im 48/168]
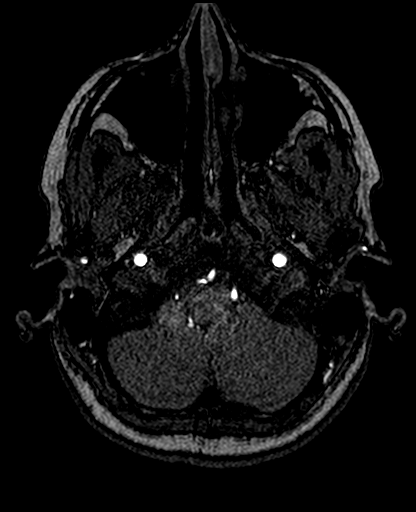
[im 72/168]
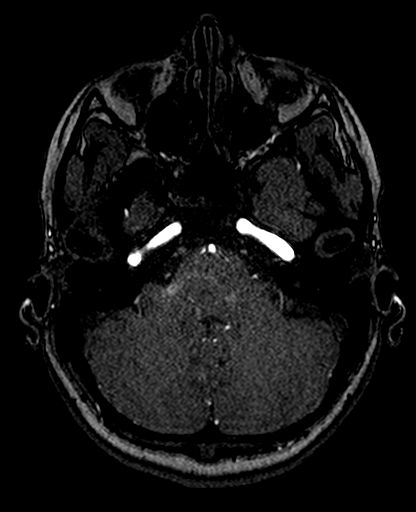
[im 96/168]
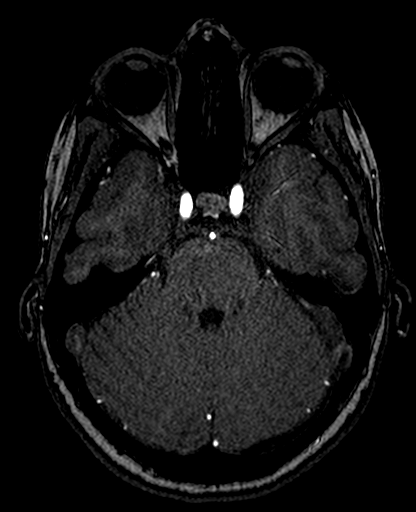
[im 120/168]
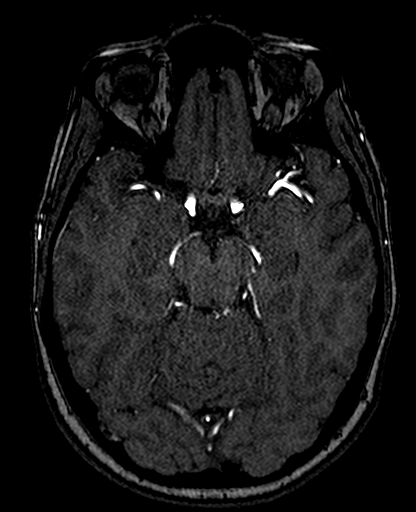
[im 144/168]
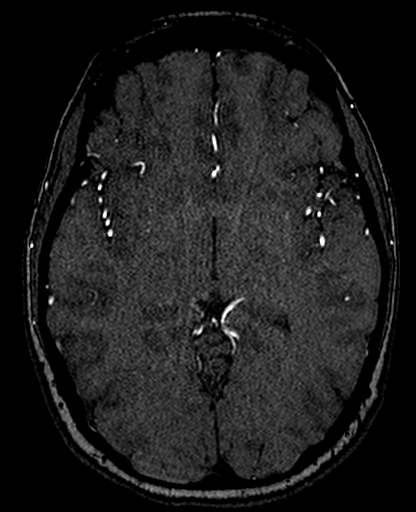
[im 168/168]
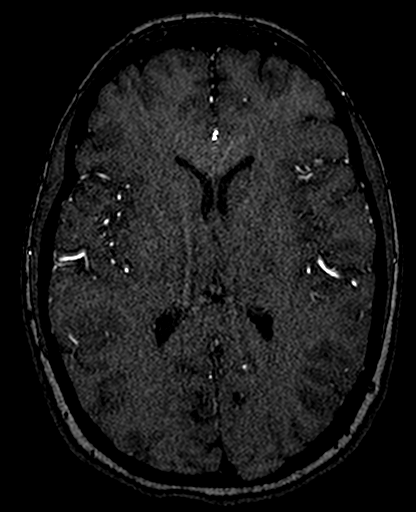

[Series 14: MRV · axial · non-contrast · 0.8mm · 0.78mm/px · z∈[-45,+95]mm · 8 of 176 slices shown (1 of 2)]
[im 1/176]
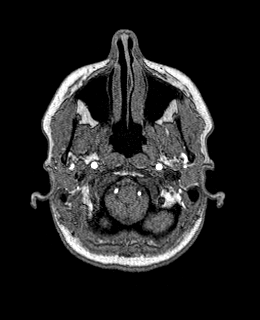
[im 26/176]
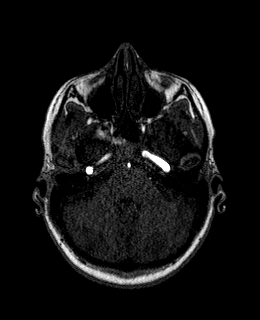
[im 51/176]
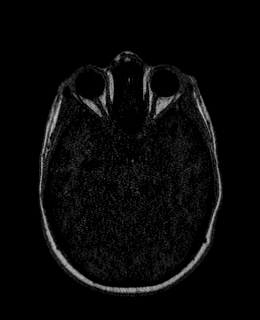
[im 76/176]
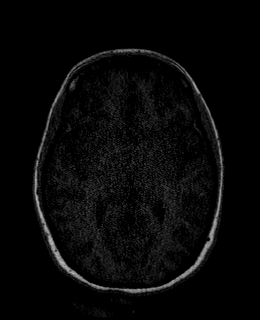
[im 101/176]
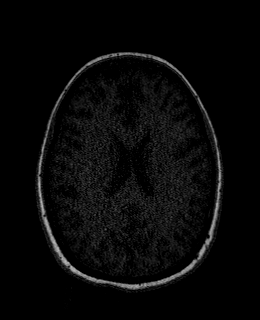
[im 126/176]
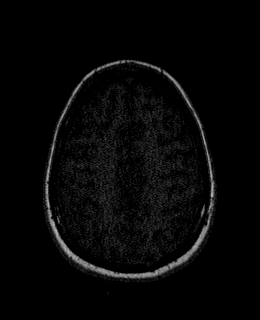
[im 151/176]
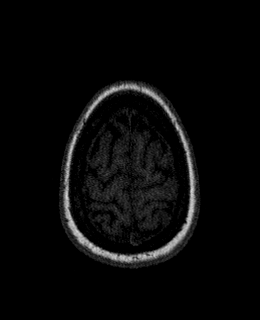
[im 176/176]
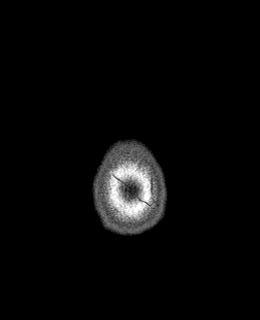

[Series 15: MRV post-contrast · axial · 0.8mm · 0.78mm/px · z∈[-45,+95]mm · 8 of 176 slices shown]
[im 1/176]
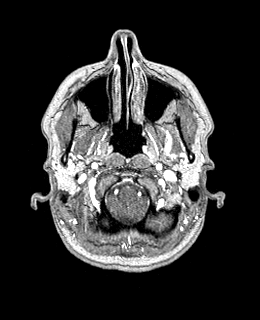
[im 26/176]
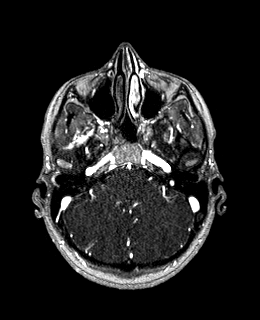
[im 51/176]
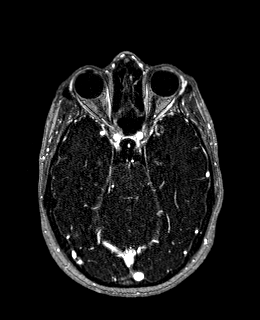
[im 76/176]
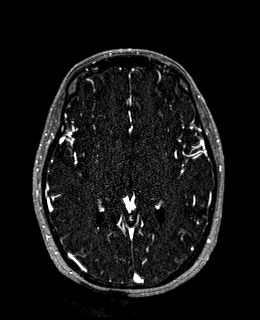
[im 101/176]
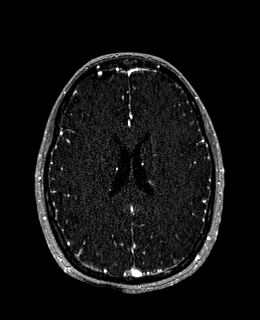
[im 126/176]
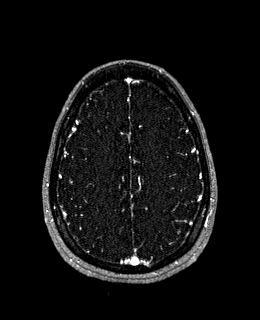
[im 151/176]
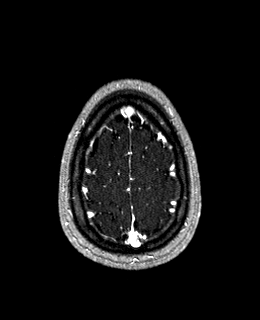
[im 176/176]
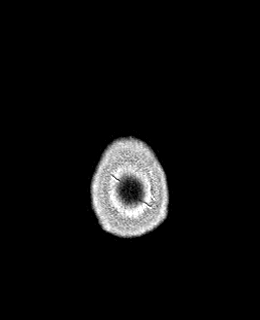

[Series 16: MRV · axial · 0.8mm · 0.78mm/px · z∈[-45,+95]mm · 8 of 176 slices shown (2 of 2)]
[im 1/176]
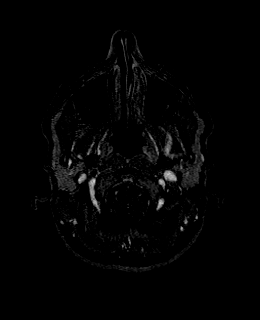
[im 26/176]
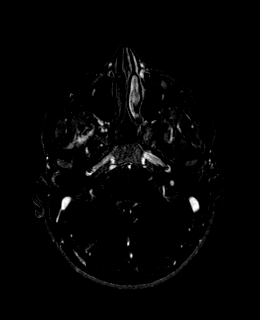
[im 51/176]
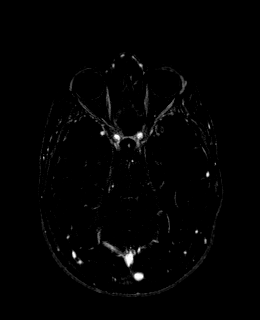
[im 76/176]
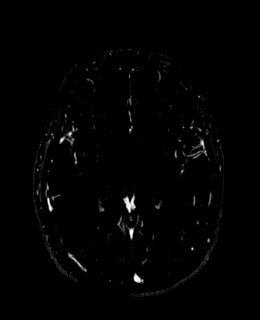
[im 101/176]
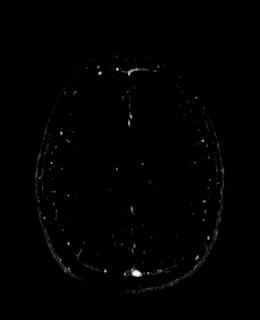
[im 126/176]
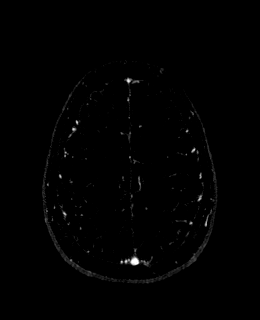
[im 151/176]
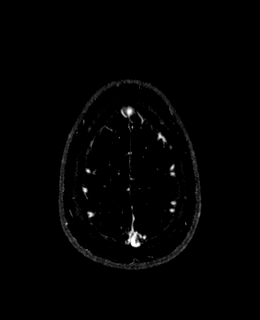
[im 176/176]
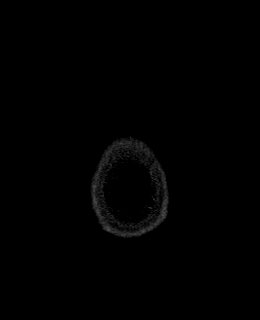

[36 of 48 positions shown; findings below may reference images not displayed]

FINDINGS: The intracranial vertebral arteries are patent. The PICA vessels are patent. The basilar, superior cerebellar, and posterior cerebral arteries are patent. Fetal type origin of the bilateral PCA vessels. Bilateral intracranial ICA vessels are patent. The anterior communicating artery is patent. Included portions of the ACA and MCA vessels are patent to second and third order branches. No significant stenosis, flow gap, occlusion, dissection, or intracranial aneurysm exceeding 3 mm.
The superior sagittal sinus is patent. The confluence of the sinuses is patent. The straight sinus, great vein of Pettinger, internal cerebral veins, and basal veins of Elmer are patent. The bilateral transverse, sigmoid, and jugular sinuses are patent. No filling defect, mass, or obstruction. No evidence for dural venous sinus thrombosis.
No restricted diffusion or associated ADC mapping defect to suggest acute or early subacute infarction.
IMPRESSION: Unremarkable MRA/MRV of the head and dural venous sinuses with anatomical variation as above.
Is the patient pregnant?
No

## 2023-09-13 IMAGING — CT CT ABDOMEN PELVIS WITHOUT CONTRAST
2 of 4 series · 17 of 46 positions shown, 19 images · non-contrast
Comparison: None.

Hx of Salpingectomy (Bilateral)
FINAL REPORT:
CT ABDOMEN PELVIS WITHOUT CONTRAST
INDICATION: Bilateral lower extremity edema.
TECHNIQUE: CT of the abdomen and pelvis was performed without contrast. Coronal and sagittal reformats were submitted for evaluation.
DLP: 307.96 mGy-cm

[Series 3: thins · axial · 0.76mm/px · z∈[-413,-26]mm · 14 of 672 slices shown, 16 images]
[im 27/672  soft-tissue]
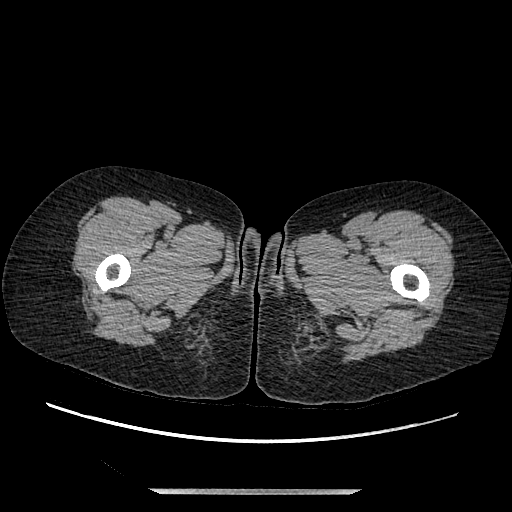
[im 27/672  bone]
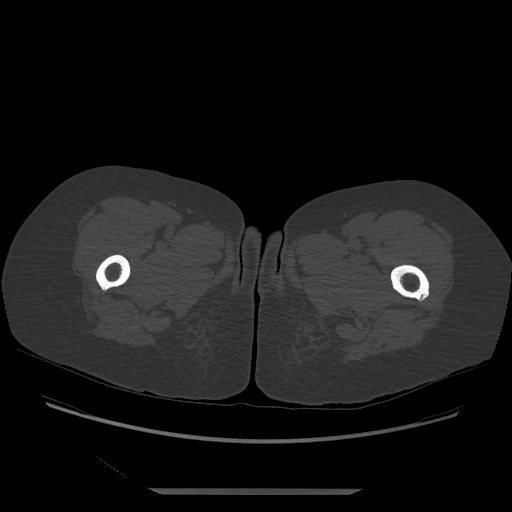
[im 81/672  soft-tissue]
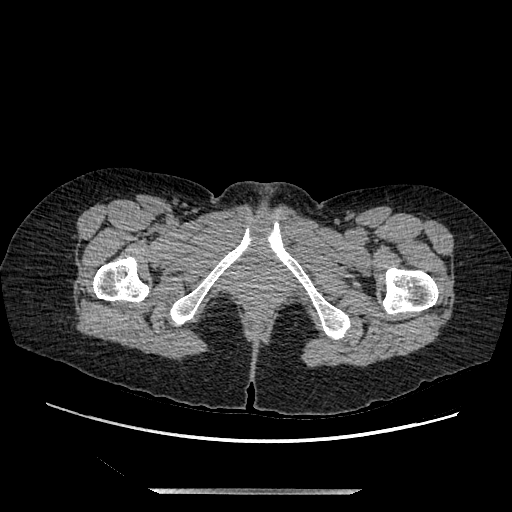
[im 135/672  soft-tissue]
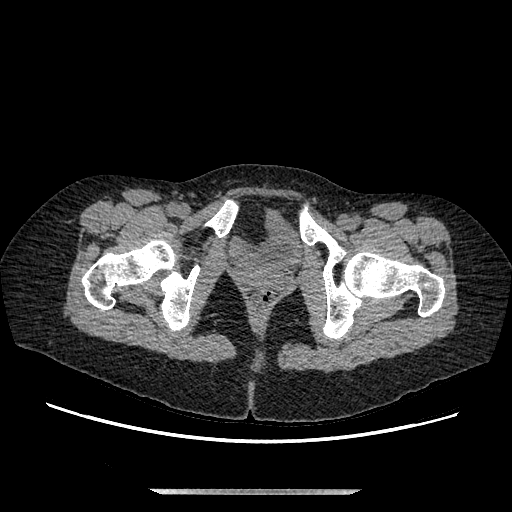
[im 188/672  soft-tissue]
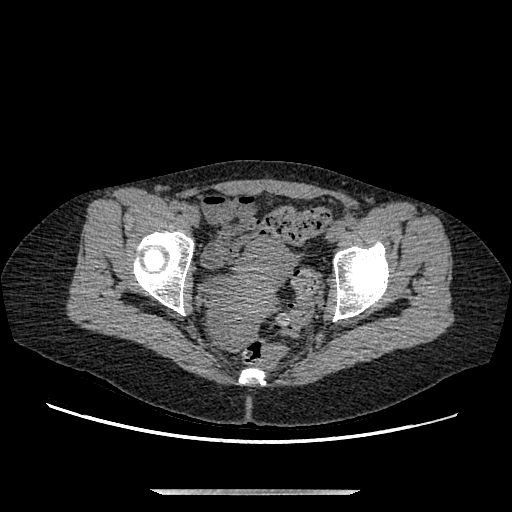
[im 215/672  soft-tissue]
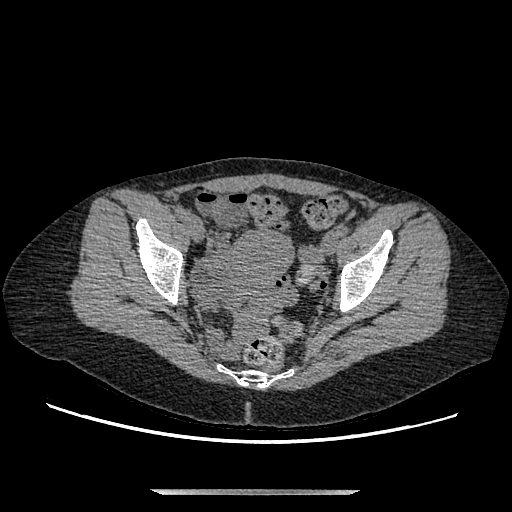
[im 269/672  soft-tissue]
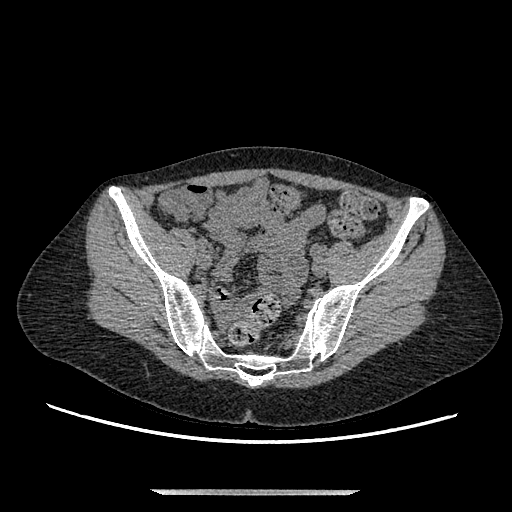
[im 323/672  soft-tissue]
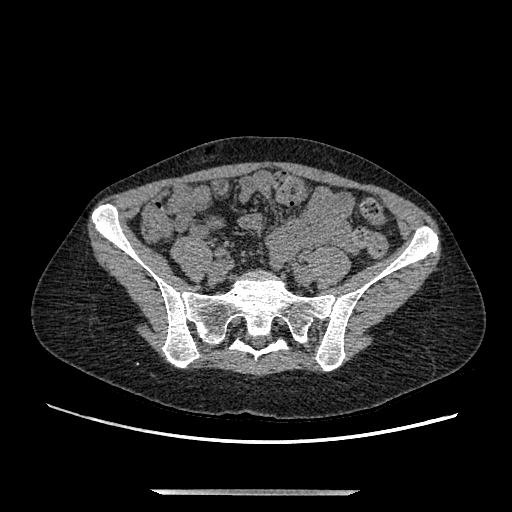
[im 349/672  soft-tissue]
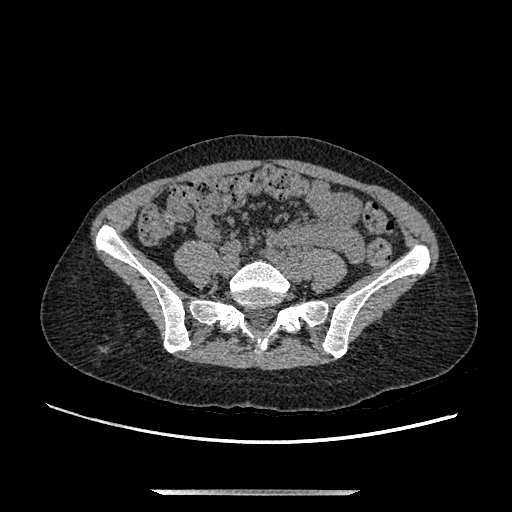
[im 403/672  soft-tissue]
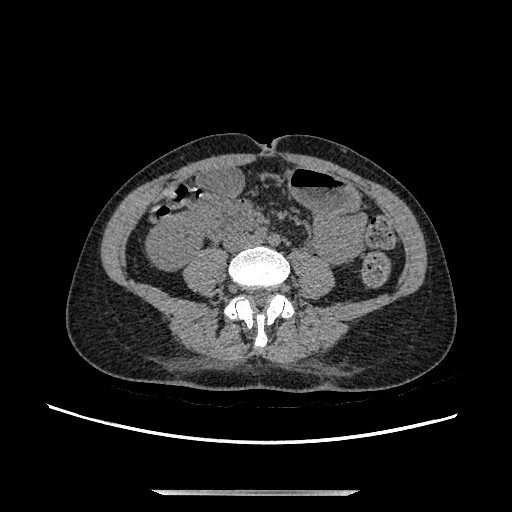
[im 403/672  bone]
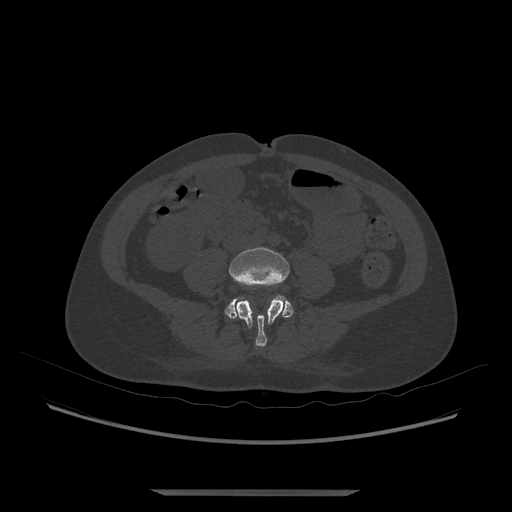
[im 457/672  soft-tissue]
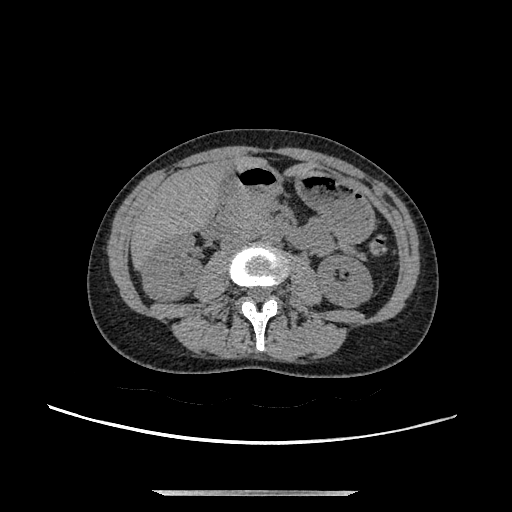
[im 510/672  soft-tissue]
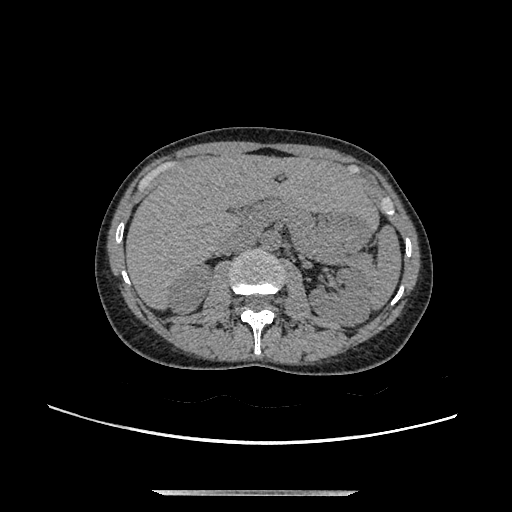
[im 537/672  soft-tissue]
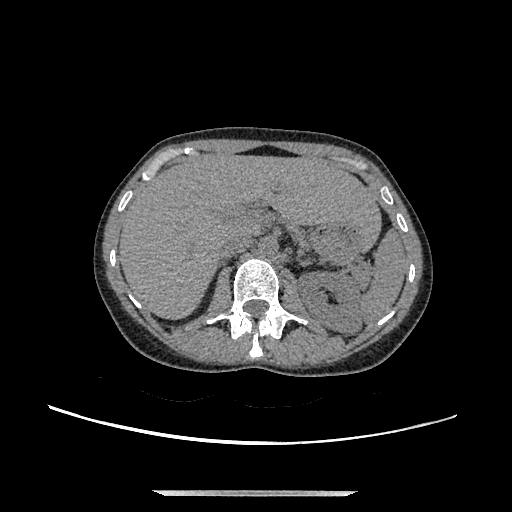
[im 591/672  soft-tissue]
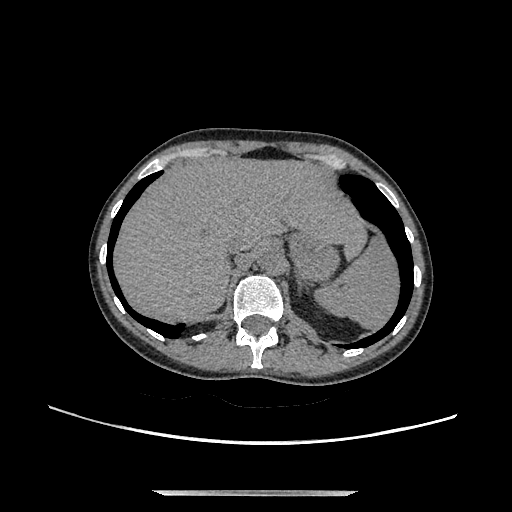
[im 645/672  soft-tissue]
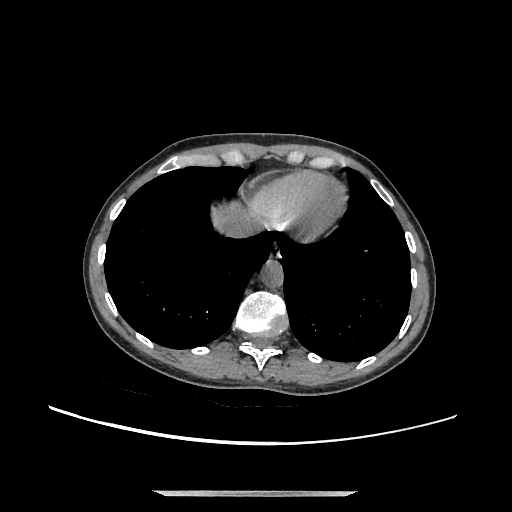

[Series 602: sag standard 2x2 · sagittal · 0.82mm/px · 3 of 195 slices shown]
[im 65/195  soft-tissue]
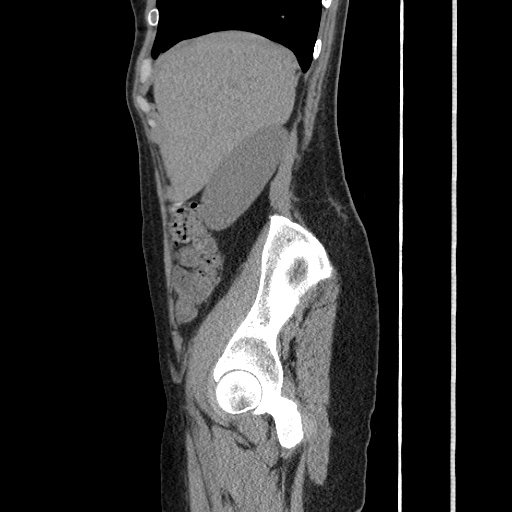
[im 87/195  soft-tissue]
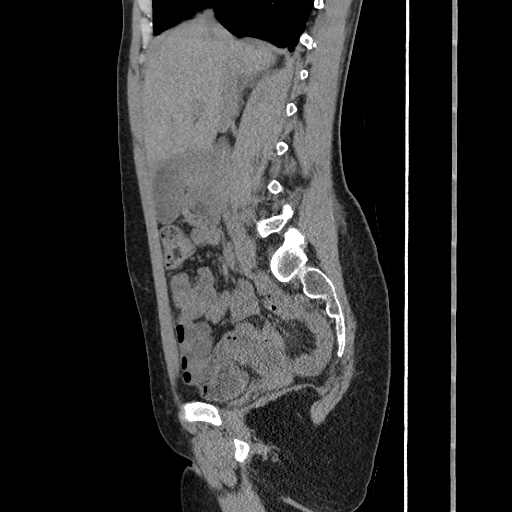
[im 108/195  soft-tissue]
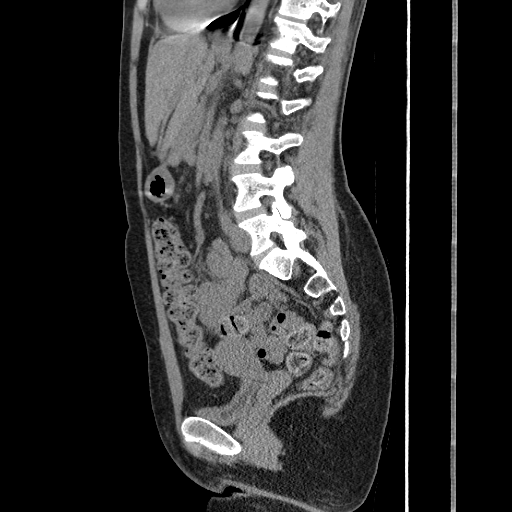

[17 of 46 positions shown; findings below may reference images not displayed]

FINDINGS: Lung Bases: Unremarkable.
Heart: Unremarkable.
Distal Esophagus and Stomach: Unremarkable.
Small and Large Bowel: No small bowel obstruction. No evidence of acute appendicitis. No colonic inflammation.
Liver/Biliary: Normal appearance of the gallbladder. No biliary dilation. No focal hepatic lesions.
Spleen: Unremarkable.
Pancreas: Unremarkable.
Adrenal Glands: Unremarkable.
Kidneys: No urolithiasis or hydronephrosis.
Bladder: Nondistended.
Reproductive: Normal appearance of the uterus. No suspicious adnexal masses. 1.8 cm right ovarian follicular cyst.
Vascular: Normal caliber of the aorta.
Lymph Nodes: No suspicious lymphadenopathy.
Soft Tissues: Small foci of soft tissue gas in the right anterior pelvic subcutaneous tissues which may be related to injections. No free air. No drainable fluid collections.
Bones: No suspicious lytic or blastic lesions.
IMPRESSION: 
IMPRESSION: No acute findings in the abdomen or pelvis.
All CT scans at this facility use iterative reconstruction technique, dose modulation, and/or weight based dosing when appropriate to reduce radiation dose to as low as reasonably achievable.
Is the patient pregnant?
Unknown

## 2023-09-16 IMAGING — CT CT GUIDED NEEDLE BIOPSY KIDNEY
1 of 6 series · 8 of 32 positions shown, 13 images · non-contrast
Comparison: none

CT GUIDED LEFT KIDNEY BIOPSY
Dr. Renne Dewberry
[place] - CT
[place] - RN
12 minutes sedation time
FINAL REPORT:
INDICATION: Proteinuria.
Procedure:
1.  CT guided biopsy of left kidney (nontargeted). Siemens Caredose 4d dose reduction was used. All CT scans at this facility use dose modulation and/or weight based dosing when appropriate to reduce radiation dose to as low as reasonably achievable.
Sedation: Moderate conscious sedation was obtained using graded doses of Fentanyl and Versed for a total sedation time of 12 minutes. A trained intraprocedural observer was present at all times. Direct face-to-face monitoring was provided during sedation.
Antibiotic: Not indicated.
TECHNIQUE: The risks and benefits of the procedure were discussed with the patient. Informed consent was obtained.
The patient was placed on the CT table in prone position. An appropriate access site was selected. The area was sterilely prepared and draped in the usual fashion. Local anesthesia was obtained using 1% lidocaine. A 15-gauge coaxial needle was advanced under CT guidance to the target of interest. 4 16-gauge core biopsies were obtained. A very small volume of Gelfoam slurry was injected into the biopsy tract. The needle was then removed and an occlusive bandage was placed over the access site.

[Series 2: biopsy · axial · 0.73mm/px · z∈[-248,-123]mm · 8 of 33 slices shown, 13 images]
[im 4/33  soft-tissue]
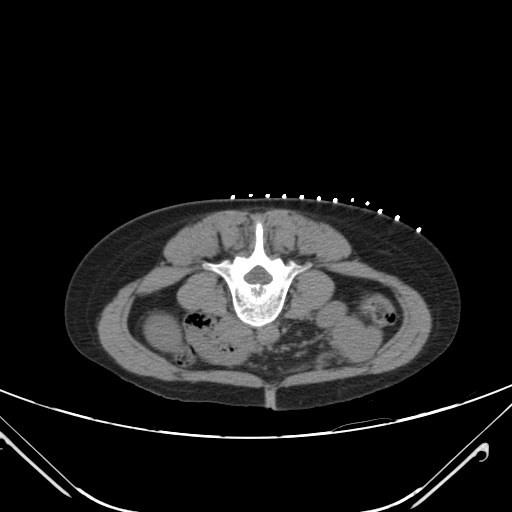
[im 4/33  bone]
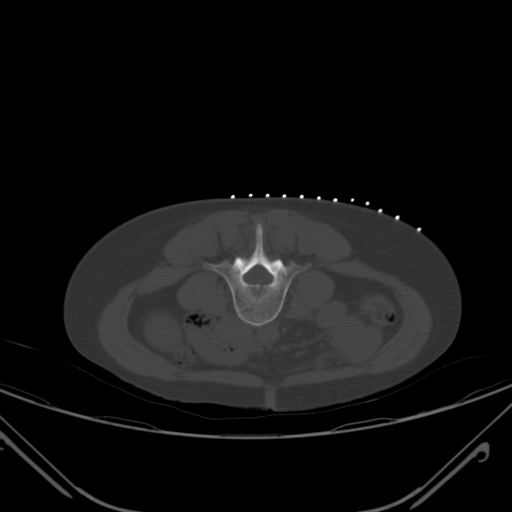
[im 8/33  soft-tissue]
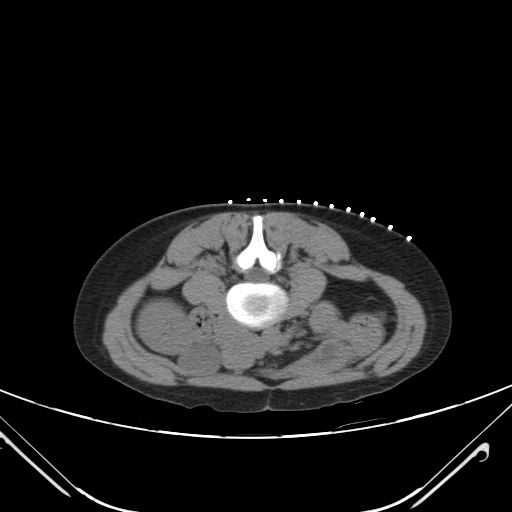
[im 11/33  soft-tissue]
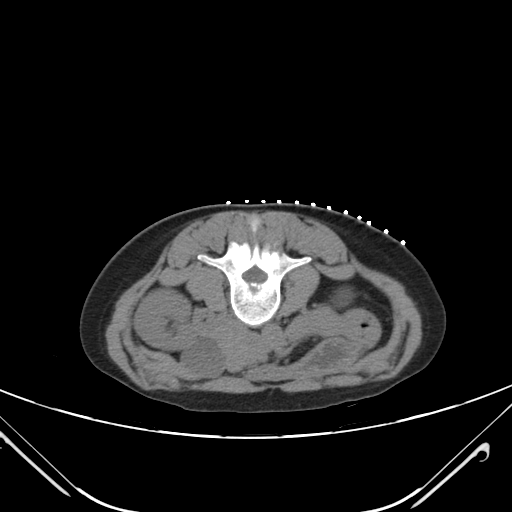
[im 15/33  soft-tissue]
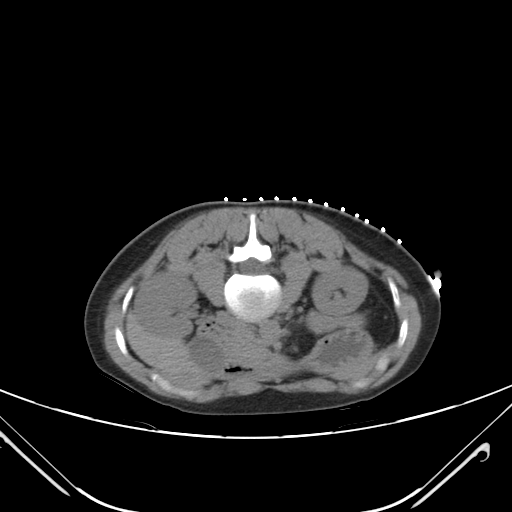
[im 18/33  soft-tissue]
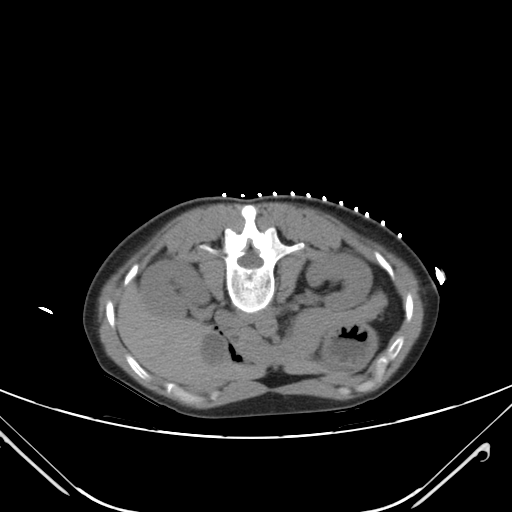
[im 18/33  lung]
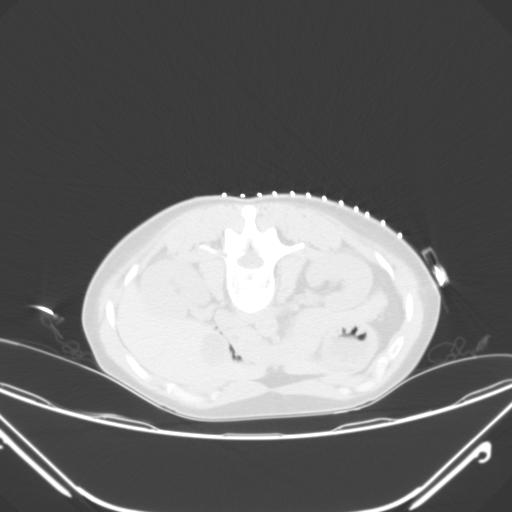
[im 22/33  soft-tissue]
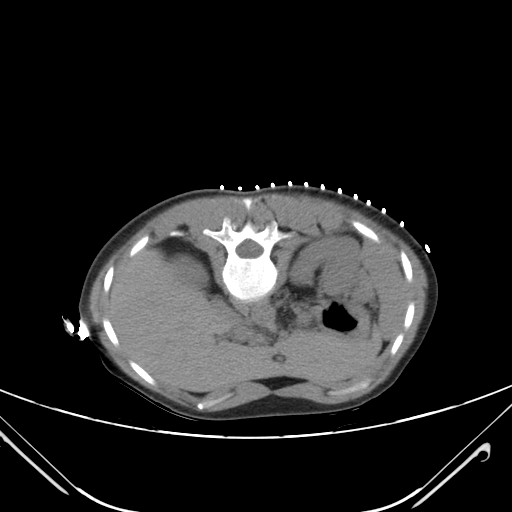
[im 22/33  lung]
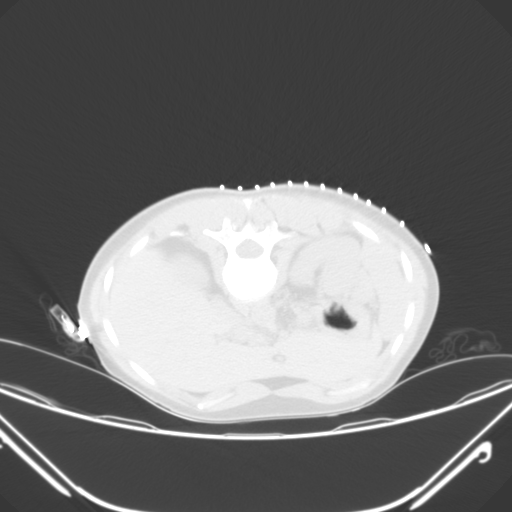
[im 25/33  soft-tissue]
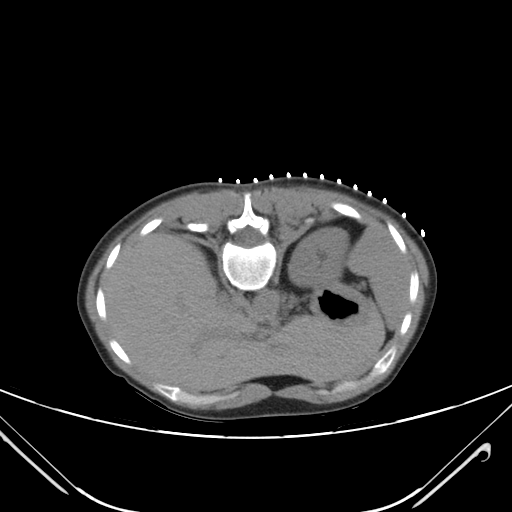
[im 25/33  lung]
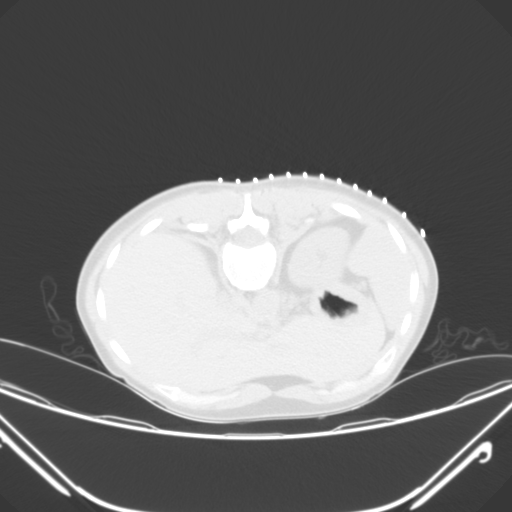
[im 29/33  soft-tissue]
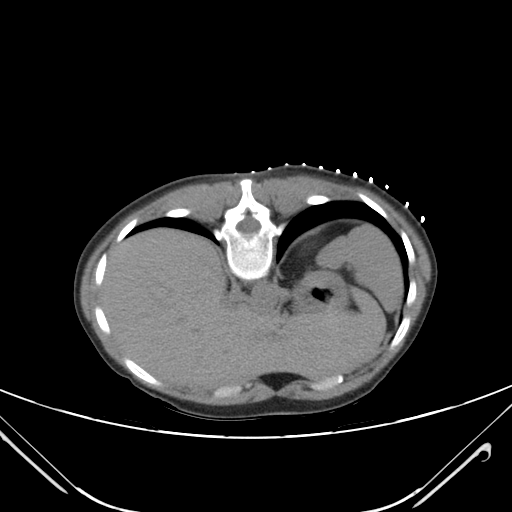
[im 29/33  lung]
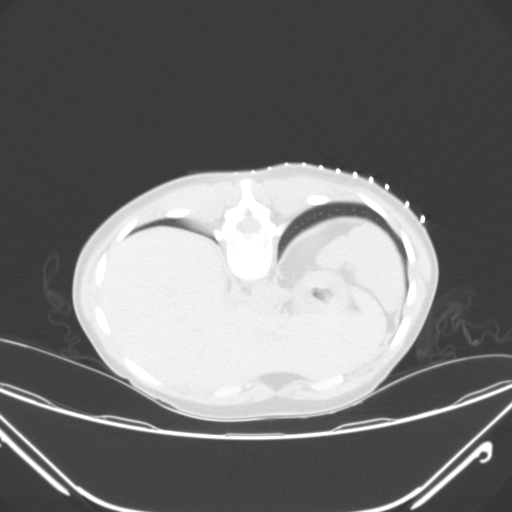

[8 of 32 positions shown; findings below may reference images not displayed]

FINDINGS: 1.  Initial CT showed unremarkable kidneys..
2.  Postprocedural imaging showed expected postprocedural findings..
IMPRESSION: 
IMPRESSION: Technically successful CT-guided biopsy of left kidney (nontargeted).
Final pathology is pending.
Is the patient pregnant?
No
